# Patient Record
Sex: Female | Born: 1980 | Race: Black or African American | Hispanic: No | Marital: Single | Smoking: Current every day smoker
Health system: Southern US, Community
[De-identification: ages and names within clinical notes are randomized; demographics above are authoritative.]

## PROBLEM LIST (undated history)

## (undated) DIAGNOSIS — R569 Unspecified convulsions: Secondary | ICD-10-CM

## (undated) DIAGNOSIS — O169 Unspecified maternal hypertension, unspecified trimester: Secondary | ICD-10-CM

## (undated) DIAGNOSIS — G4730 Sleep apnea, unspecified: Secondary | ICD-10-CM

## (undated) DIAGNOSIS — J45909 Unspecified asthma, uncomplicated: Secondary | ICD-10-CM

## (undated) HISTORY — PX: TUBAL LIGATION: SHX77

---

## 2011-12-04 ENCOUNTER — Encounter (HOSPITAL_BASED_OUTPATIENT_CLINIC_OR_DEPARTMENT_OTHER): Payer: Self-pay | Admitting: Emergency Medicine

## 2011-12-04 ENCOUNTER — Emergency Department (HOSPITAL_BASED_OUTPATIENT_CLINIC_OR_DEPARTMENT_OTHER)
Admission: EM | Admit: 2011-12-04 | Discharge: 2011-12-04 | Disposition: A | Discharge status: Home / Self Care | Payer: Self-pay | Attending: Emergency Medicine | Admitting: Emergency Medicine

## 2011-12-04 DIAGNOSIS — F17200 Nicotine dependence, unspecified, uncomplicated: Secondary | ICD-10-CM | POA: Insufficient documentation

## 2011-12-04 DIAGNOSIS — J069 Acute upper respiratory infection, unspecified: Secondary | ICD-10-CM | POA: Insufficient documentation

## 2011-12-04 MED ORDER — IBUPROFEN 800 MG PO TABS: 800.0000 mg | ORAL_TABLET | Freq: Three times a day (TID) | ORAL | Status: AC | PRN

## 2011-12-04 NOTE — ED Provider Notes (Signed)
History     CSN: [id number]  Arrival date & time 12/04/11  1723   First MD Initiated Contact with Patient 12/04/11 2008      Chief Complaint  Patient presents with  . Sore Throat    (Consider location/radiation/quality/duration/timing/severity/associated sxs/prior treatment) HPI Comments: Patient reports she has been sick x 5 days.  States it started with a sore throat and is now going down into her chest.  Reports fevers the first few days that have now resolved.  States fever was as high as 105.   Had had cough, nasal congestion, bilateral ear pain, sore throat.  Denies CP, SOB.  Pt has had recent sick contacts with similar symptoms.  Pt is a smoker.  Has not been taking any medications for her symptoms.    Patient is a 31 y.o. female presenting with pharyngitis. The history is provided by the patient.  Sore Throat Associated symptoms include congestion, coughing, a fever and a sore throat. Pertinent negatives include no chest pain or neck pain.    History reviewed. No pertinent past medical history.  Past Surgical History  Procedure Date  . Tubal ligation   . Cesarean section x 3    No family history on file.  History  Substance Use Topics  . Smoking status: Current Everyday Smoker    Types: Cigarettes  . Smokeless tobacco: Not on file  . Alcohol Use: No    OB History    Grav Para Term Preterm Abortions TAB SAB Ect Mult Living                  Review of Systems  Constitutional: Positive for fever.  HENT: Positive for ear pain, congestion, sore throat and rhinorrhea. Negative for trouble swallowing, neck pain and neck stiffness.   Respiratory: Positive for cough. Negative for shortness of breath, wheezing and stridor.   Cardiovascular: Negative for chest pain.    Allergies  Review of patient's allergies indicates no known allergies.  Home Medications   Current Outpatient Rx  Name Route Sig Dispense Refill  . GREEN COFFEE BEAN 400 MG PO CAPS Oral Take 1  capsule by mouth daily.      BP 152/89  Pulse 99  Temp 98.3 F (36.8 C) (Oral)  Resp 16  Ht 5\' 2"  (1.575 m)  Wt 242 lb (109.77 kg)  BMI 44.26 kg/m2  SpO2 98%  LMP 10/11/2011  Physical Exam  Nursing note and vitals reviewed. Constitutional: She appears well-developed and well-nourished. No distress.  HENT:  Head: Normocephalic and atraumatic.  Mouth/Throat: Uvula is midline. Mucous membranes are not dry. No uvula swelling. Posterior oropharyngeal edema and posterior oropharyngeal erythema present. No oropharyngeal exudate or tonsillar abscesses.       Bilateral tonsillar enlargement is symmetric.    Eyes: Conjunctivae are normal. Right eye exhibits no discharge. Left eye exhibits no discharge. No scleral icterus.  Neck: Neck supple.  Cardiovascular: Normal rate and regular rhythm.   Pulmonary/Chest: Effort normal and breath sounds normal. No respiratory distress. She has no wheezes. She has no rales.  Neurological: She is alert.  Skin: She is not diaphoretic.  Psychiatric: She has a normal mood and affect. Her behavior is normal.    ED Course  Procedures (including critical care time)   Labs Reviewed  RAPID STREP SCREEN   No results found.   1. URI (upper respiratory infection)       MDM  Pt with upper respiratory symptoms x 5 days.  Symptoms are progressing  from sore throat to cough.   Currently afebrile, nontoxic.  Lungs CTAB, oroaphrynx with tonsillar enlargement but without exudate or anterior cervical lymphadenopathy.  Strep screen is negative.  Per centor criteria, no need for strep A dna test.  Likely viral infection.  Discussed diagnosis and plan with patient.  Pt given return precautions.  Pt verbalizes understanding and agrees with plan.          [place], [place] 12/04/11 2032

## 2011-12-04 NOTE — ED Provider Notes (Signed)
Medical screening examination/treatment/procedure(s) were performed by non-physician practitioner and as supervising physician I was immediately available for consultation/collaboration.   Rolan Bucco, MD 12/04/11 [phone]

## 2011-12-04 NOTE — ED Notes (Signed)
PA at bedside for evaluation

## 2011-12-04 NOTE — ED Notes (Signed)
Pt c/o sore throat since Tues

## 2013-01-09 ENCOUNTER — Encounter (HOSPITAL_COMMUNITY): Payer: Self-pay | Admitting: Emergency Medicine

## 2013-01-09 ENCOUNTER — Emergency Department (HOSPITAL_COMMUNITY)
Admission: EM | Admit: 2013-01-09 | Discharge: 2013-01-10 | Discharge status: Against Medical Advice | Payer: Self-pay | Attending: Emergency Medicine | Admitting: Emergency Medicine

## 2013-01-09 DIAGNOSIS — R5381 Other malaise: Secondary | ICD-10-CM | POA: Insufficient documentation

## 2013-01-09 DIAGNOSIS — R42 Dizziness and giddiness: Secondary | ICD-10-CM | POA: Insufficient documentation

## 2013-01-09 DIAGNOSIS — R51 Headache: Secondary | ICD-10-CM | POA: Insufficient documentation

## 2013-01-09 DIAGNOSIS — F17200 Nicotine dependence, unspecified, uncomplicated: Secondary | ICD-10-CM | POA: Insufficient documentation

## 2013-01-09 NOTE — ED Notes (Signed)
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 Pt reports that she has a HA, dizziness and has been feeling fatigued, increasing over the last 24 hours.  Pt has not been further evaluated or take any medications at this time for seizures.

## 2013-01-10 NOTE — ED Notes (Signed)
Pt reporting desire to go home and no longer wants to wait for MD.

## 2013-07-30 ENCOUNTER — Emergency Department (HOSPITAL_COMMUNITY)
Admission: EM | Admit: 2013-07-30 | Discharge: 2013-07-30 | Disposition: A | Discharge status: Home / Self Care | Payer: Medicaid Other | Attending: Emergency Medicine | Admitting: Emergency Medicine

## 2013-07-30 ENCOUNTER — Encounter (HOSPITAL_COMMUNITY): Payer: Self-pay | Admitting: Emergency Medicine

## 2013-07-30 DIAGNOSIS — F17200 Nicotine dependence, unspecified, uncomplicated: Secondary | ICD-10-CM | POA: Insufficient documentation

## 2013-07-30 DIAGNOSIS — K029 Dental caries, unspecified: Secondary | ICD-10-CM | POA: Insufficient documentation

## 2013-07-30 DIAGNOSIS — K089 Disorder of teeth and supporting structures, unspecified: Secondary | ICD-10-CM | POA: Insufficient documentation

## 2013-07-30 DIAGNOSIS — K0889 Other specified disorders of teeth and supporting structures: Secondary | ICD-10-CM

## 2013-07-30 MED ORDER — HYDROCODONE-ACETAMINOPHEN 5-325 MG PO TABS: 1.0000 | ORAL_TABLET | ORAL | Status: DC | PRN

## 2013-07-30 MED ORDER — PENICILLIN V POTASSIUM 500 MG PO TABS: 500.0000 mg | ORAL_TABLET | Freq: Four times a day (QID) | ORAL | Status: AC

## 2013-07-30 MED ORDER — OXYCODONE-ACETAMINOPHEN 5-325 MG PO TABS
1.0000 | ORAL_TABLET | Freq: Once | ORAL | Status: AC
  Administered 2013-07-30: 1 via ORAL
  Filled 2013-07-30: qty 1

## 2013-07-30 NOTE — ED Provider Notes (Signed)
CSN: [id number]     Arrival date & time 07/30/13  1132 History  This chart was scribed for non-physician practitioner, Trixie DredgeEmily Chistian Kasler, PA-C working with Lyanne CoKevin M Campos, MD by Greggory StallionKayla Andersen, ED scribe. This patient was seen in room TR10C/TR10C and the patient's care was started at 1:38 PM.   Chief Complaint  Patient presents with  . Dental Pain   The history is provided by the patient. No language interpreter was used.   HPI Comments: Allison James is a 33 y.o. female who presents to the Emergency Department complaining of gradual onset, intermittent left lower dental pain that started one year ago. Her current episode started 2 weeks ago. Pt states she has also started to have right sided dental pain and intermittent facial swelling. Eating worsens the pain. She has tried [facility] powder on her teeth and taken Advil and Aleve with no relief. Pt has also swished peroxide with no relief. Pt has tried to call a dentist but she states she has to wait on her Medicaid. Denies fever, sore throat, trouble swallowing, difficulty breathing.   History reviewed. No pertinent past medical history. Past Surgical History  Procedure Laterality Date  . Tubal ligation    . Cesarean section  x 3   History reviewed. No pertinent family history. History  Substance Use Topics  . Smoking status: Current Every Day Smoker -- 1.00 packs/day    Types: Cigarettes  . Smokeless tobacco: Never Used  . Alcohol Use: Yes     Comment: socially   OB History   Grav Para Term Preterm Abortions TAB SAB Ect Mult Living                 Review of Systems  Constitutional: Negative for fever.  HENT: Positive for dental problem and facial swelling. Negative for sore throat and trouble swallowing.   All other systems reviewed and are negative.   Allergies  Review of patient's allergies indicates no known allergies.  Home Medications  No current outpatient prescriptions on file.  BP 159/97  Pulse 101  Temp(Src) 98.3 F (36.8 C)   Resp 18  SpO2 97%  Physical Exam  Nursing note and vitals reviewed. Constitutional: She appears well-developed and well-nourished. No distress.  HENT:  Head: Normocephalic and atraumatic.  Hypertrophic tonsils. No erythema or exudate. Left lower second molar with remote fracture and deep decay. Tender to percussion. No paratracheal tenderness. Left mandibular tenderness. No facial swelling.  Neck: Neck supple.  Pulmonary/Chest: Effort normal.  Lymphadenopathy:  No submandibular, submental or anterior cervical lymphadenopathy.   Neurological: She is alert.  Skin: She is not diaphoretic.    ED Course  Procedures (including critical care time)  DIAGNOSTIC STUDIES: Oxygen Saturation is 97% on RA, normal by my interpretation.    COORDINATION OF CARE: 1:43 PM-Discussed treatment plan which includes an antibiotic and pain medication with pt at bedside and pt agreed to plan. Will give pt dental referrals and advised her to follow up.   Labs Review Labs Reviewed - No data to display Imaging Review No results found.   EKG Interpretation None      MDM   Final diagnoses:  Dental caries  Pain, dental     Afebrile, nontoxic patient with new dental pain.  No obvious abscess.  No concerning findings on exam.Patient with remote fracture to molar with worsening pain. No facial swelling the patient does have tenderness to percussion of the molar.   Doubt deep space head or neck infection.  Doubt  Ludwig's angina.  D/C home with pain medication and dental follow up.  Discussed findings, treatment, and follow up  with patient.  Pt given return precautions.  Pt verbalizes understanding and agrees with plan.       I personally performed the services described in this documentation, which was scribed in my presence. The recorded information has been reviewed and is accurate.  Trixie Dredge, PA-C 07/30/13 1626

## 2013-07-30 NOTE — Discharge Instructions (Signed)
Read the information below.  Use the prescribed medication as directed.  Please discuss all new medications with your pharmacist.  Do not take additional tylenol while taking the prescribed pain medication to avoid overdose.  You may return to the Emergency Department at any time for worsening condition or any new symptoms that concern you.  If there is any possibility that you might be pregnant, please let your health care provider know and discuss this with the pharmacist to ensure medication safety.  Please call the dentist listed above within 48 hours to schedule a close follow up appointment.  If you develop fevers, swelling in your face, difficulty swallowing or breathing, return to the ER immediately for a recheck.     Dental Caries  Dental caries (also called tooth decay) is the most common oral disease. It can occur at any age, but is more common in children and young adults.  HOW DENTAL CARIES DEVELOPS  The process of decay begins when bacteria and foods (particularly sugars and starches) combine in your mouth to produce plaque. Plaque is a substance that sticks to the hard, outer surface of a tooth (enamel). The bacteria in plaque produce acids that attack enamel. These acids may also attack the root surface of a tooth (cementum) if it is exposed. Repeated attacks dissolve these surfaces and create holes in the tooth (cavities). If left untreated, the acids destroy the other layers of the tooth.  RISK FACTORS  Frequent sipping of sugary beverages.   Frequent snacking on sugary and starchy foods, especially those that easily get stuck in the teeth.   Poor oral hygiene.   Dry mouth.   Substance abuse such as methamphetamine abuse.   Broken or poor-fitting dental restorations.   Eating disorders.   Gastroesophageal reflux disease (GERD).   Certain radiation treatments to the head and neck. SYMPTOMS In the early stages of dental caries, symptoms are seldom present. Sometimes  white, chalky areas may be seen on the enamel or other tooth layers. In later stages, symptoms may include:  Pits and holes on the enamel.  Toothache after sweet, hot, or cold foods or drinks are consumed.  Pain around the tooth.  Swelling around the tooth. DIAGNOSIS  Most of the time, dental caries is detected during a regular dental checkup. A diagnosis is made after a thorough medical and dental history is taken and the surfaces of your teeth are checked for signs of dental caries. Sometimes special instruments, such as lasers, are used to check for dental caries. Dental X-ray exams may be taken so that areas not visible to the eye (such as between the contact areas of the teeth) can be checked for cavities.  TREATMENT  If dental caries is in its early stages, it may be reversed with a fluoride treatment or an application of a remineralizing agent at the dental office. Thorough brushing and flossing at home is needed to aid these treatments. If it is in its later stages, treatment depends on the location and extent of tooth destruction:   If a small area of the tooth has been destroyed, the destroyed area will be removed and cavities will be filled with a material such as gold, silver amalgam, or composite resin.   If a large area of the tooth has been destroyed, the destroyed area will be removed and a cap (crown) will be fitted over the remaining tooth structure.   If the center part of the tooth (pulp) is affected, a procedure called a  root canal will be needed before a filling or crown can be placed.   If most of the tooth has been destroyed, the tooth may need to be pulled (extracted). HOME CARE INSTRUCTIONS You can prevent, stop, or reverse dental caries at home by practicing good oral hygiene. Good oral hygiene includes:  Thoroughly cleaning your teeth at least twice a day with a toothbrush and dental floss.   Using a fluoride toothpaste. A fluoride mouth rinse may also be  used if recommended by your dentist or health care provider.   Restricting the amount of sugary and starchy foods and sugary liquids you consume.   Avoiding frequent snacking on these foods and sipping of these liquids.   Keeping regular visits with a dentist for checkups and cleanings. PREVENTION   Practice good oral hygiene.  Consider a dental sealant. A dental sealant is a coating material that is applied by your dentist to the pits and grooves of teeth. The sealant prevents food from being trapped in them. It may protect the teeth for several years.  Ask about fluoride supplements if you live in a community without fluorinated water or with water that has a low fluoride content. Use fluoride supplements as directed by your dentist or health care provider.  Allow fluoride varnish applications to teeth if directed by your dentist or health care provider. Document Released: 01/02/2002 Document Revised: 12/13/2012 Document Reviewed: 04/14/2012 [facility] Patient Information 2014 [place], [place].  Dental Care and Dentist Visits Dental care supports good overall health. Regular dental visits can also help you avoid dental pain, bleeding, infection, and other more serious health problems in the future. It is important to keep the mouth healthy because diseases in the teeth, gums, and other oral tissues can spread to other areas of the body. Some problems, such as diabetes, heart disease, and pre-term labor have been associated with poor oral health.  See your dentist every 6 months. If you experience emergency problems such as a toothache or broken tooth, go to the dentist right away. If you see your dentist regularly, you may catch problems early. It is easier to be treated for problems in the early stages.  WHAT TO EXPECT AT A DENTIST VISIT  Your dentist will look for many common oral health problems and recommend proper treatment. At your regular dental visit, you can expect:  Gentle  cleaning of the teeth and gums. This includes scraping and polishing. This helps to remove the sticky substance around the teeth and gums (plaque). Plaque forms in the mouth shortly after eating. Over time, plaque hardens on the teeth as tartar. If tartar is not removed regularly, it can cause problems. Cleaning also helps remove stains.  Periodic X-rays. These pictures of the teeth and supporting bone will help your dentist assess the health of your teeth.  Periodic fluoride treatments. Fluoride is a natural mineral shown to help strengthen teeth. Fluoride treatmentinvolves applying a fluoride gel or varnish to the teeth. It is most commonly done in children.  Examination of the mouth, tongue, jaws, teeth, and gums to look for any oral health problems, such as:  Cavities (dental caries). This is decay on the tooth caused by plaque, sugar, and acid in the mouth. It is best to catch a cavity when it is small.  Inflammation of the gums caused by plaque buildup (gingivitis).  Problems with the mouth or malformed or misaligned teeth.  Oral cancer or other diseases of the soft tissues or jaws. KEEP YOUR TEETH AND  GUMS HEALTHY For healthy teeth and gums, follow these general guidelines as well as your dentist's specific advice:  Have your teeth professionally cleaned at the dentist every 6 months.  Brush twice daily with a fluoride toothpaste.  Floss your teeth daily.  Ask your dentist if you need fluoride supplements, treatments, or fluoride toothpaste.  Eat a healthy diet. Reduce foods and drinks with added sugar.  Avoid smoking. TREATMENT FOR ORAL HEALTH PROBLEMS If you have oral health problems, treatment varies depending on the conditions present in your teeth and gums.  Your caregiver will most likely recommend good oral hygiene at each visit.  For cavities, gingivitis, or other oral health disease, your caregiver will perform a procedure to treat the problem. This is typically  done at a separate appointment. Sometimes your caregiver will refer you to another dental specialist for specific tooth problems or for surgery. SEEK IMMEDIATE DENTAL CARE IF:  You have pain, bleeding, or soreness in the gum, tooth, jaw, or mouth area.  A permanent tooth becomes loose or separated from the gum socket.  You experience a blow or injury to the mouth or jaw area. Document Released: 12/23/2010 Document Revised: 07/05/2011 Document Reviewed: 12/23/2010 [facility] Patient Information 2014 [place], [place].   Emergency Department Resource Guide 1) Find a Doctor and Pay Out of Pocket Although you won't have to find out who is covered by your insurance plan, it is a good idea to ask around and get recommendations. You will then need to call the office and see if the doctor you have chosen will accept you as a new patient and what types of options they offer for patients who are self-pay. Some doctors offer discounts or will set up payment plans for their patients who do not have insurance, but you will need to ask so you aren't surprised when you get to your appointment.  2) Contact Your Local Health Department Not all health departments have doctors that can see patients for sick visits, but many do, so it is worth a call to see if yours does. If you don't know where your local health department is, you can check in your phone book. The CDC also has a tool to help you locate your state's health department, and many state websites also have listings of all of their local health departments.  3) Find a Walk-in Clinic If your illness is not likely to be very severe or complicated, you may want to try a walk in clinic. These are popping up all over the country in pharmacies, drugstores, and shopping centers. They're usually staffed by nurse practitioners or physician assistants that have been trained to treat common illnesses and complaints. They're usually fairly quick and inexpensive. However,  if you have serious medical issues or chronic medical problems, these are probably not your best option.  No Primary Care Doctor: - Call Health Connect at  [phone] - they can help you locate a primary care doctor that  accepts your insurance, provides certain services, etc. - Physician Referral Service- [phone]  Chronic Pain Problems: Organization         Address  Phone   Notes  [facility]  [phone] Patients need to be referred by their primary care doctor.   Medication Assistance: Organization         Address  Phone   Notes  [facility] Medication [facility] [address] [place] [place], [place] [address] [phone] --Must be a resident of  [place] -- Must have NO insurance coverage whatsoever (no Medicaid/ Medicare, etc.) -- The pt. MUST have a primary care doctor that directs their care regularly and follows them in the community   [facility]ist  [phone]   [employer]  [phone]    Agencies that provide inexpensive medical care: Organization         Address  Phone   Notes  [facility]  [phone]   [facility]    [phone]   [facility] [address] [place], [place] [address] [phone]   [facility] [address], [place] [phone]   Planned Parenthood    [phone]   [facility]    [phone]   [facility]  [address], [place] Phone:  [phone], Fax:  [phone] Hours of Operation:  9 am - 6 pm, M-F.  Also accepts Medicaid/Medicare and self-pay.  [facility]  [address], [place] Phone: [phone], Fax: [phone]. Hours of Operation:  8:30 am - 5:30 pm, M-F.  Also accepts Medicaid and self-pay.  [facility] [place] [address], [place] Phone: [phone]   [facility] [address] Natasha Bence [place], [place] [phone], Ext. [phone] Mondays & Thursdays: 7-9 AM.  First 15 patients are seen on a first come, first serve basis.    Medicaid-accepting [facility] Providers:  Organization         Address  Phone   Notes  [facility] [address], [place] [phone] Also accepts self-pay patients.  [facility] [address] Laurell Josephs [place], [place]  [phone]   [facility] [address], [place] [phone]   [facility] [address], [place] [phone]   Renaye Rakers [address], [place]   [phone] Only accepts [place] Access [place] patients after they have their name applied to their card.   Self-Pay (no insurance) in [place]:  Organization         Address  Phone   Notes  Sickle Cell Patients, [facility] [address] [place], [place] [phone]   [facility] [address] [place], [place] [phone]   [facility] [place]  [address], [place] [phone]   [facility]/Dr. Osei-Bonsu  [address] [place] or [address], [place] [phone] Phone number for both [place] and [place] locations is the same.  [facility] [address], [place] [phone]   [facility] [address] [place] or [address] [phone]   [facility] [address] [place] [phone], phone; [phone], fax Sees patients 1st and 3rd Saturday of every month.  Must not qualify for public or private insurance (i.e. Medicaid, Medicare, [place] Health Choice, Veterans' Benefits)  Household income should be no more than 200% of the poverty level The clinic cannot treat you if you are pregnant or think you are  pregnant  Sexually transmitted diseases are not treated at the clinic.    Dental Care: Organization         Address  Phone  Notes  [facility] Department of [facility] [address] [place], [place] [phone] Accepts children up to age 32 who are enrolled in [place] or [place] Health Choice; pregnant women with a Medicaid card; and children who have applied for Medicaid or [place] Health Choice, but were declined, whose parents can pay a reduced fee at time of service.  [facility] Department of [facility]  [address], [place] [phone] Accepts children up to age 13 who are enrolled in [place] or [place] Health Choice; pregnant women with a Medicaid card; and children who have applied for Medicaid or  Health Choice, but were declined, whose parents can pay a reduced fee at time of service.  [facility]  [address] [place], [place] [phone] Patients are seen by appointment only. Walk-ins are not accepted. [facility] will see patients 76 years of age and older. Monday - Tuesday (8am-5pm) Most Wednesdays (8:30-5pm) $30 per visit, cash only  [facility] Adult Dental Access PROGRAM  [address], [facility] [phone] Patients are seen by appointment only. Walk-ins are not accepted. [facility] will see patients 48 years of age and older. One Wednesday Evening (Monthly: Volunteer Based).  $30 per visit, cash only  [employer]  [phone] for adults; Children under age 29, call Graduate Pediatric Dentistry at [phone]. Children aged 40-14, please call [phone] to request a pediatric application.  Dental services are provided in all areas of dental care including fillings, crowns and bridges, complete and partial dentures, implants, gum treatment, root canals, and extractions. Preventive care is also provided. Treatment is provided to both adults and  children. Patients are selected via a lottery and there is often a waiting list.   [facility] [address], [place]  [phone] [email or web address]   [facility] [address] [place], [place] [phone], Ext. [phone] Second and Fourth Thursday of each month, opens at 6:30 AM; Clinic ends at 9 AM.  Patients are seen on a first-come first-served basis, and a limited number are seen during each clinic.   [facility]  [address] [place], [place] [phone]   Eligibility Requirements You must have lived in [place], [place], or [place] counties for at least the last three months.   You cannot be eligible for state or federal sponsored [place], including [employer], [place], or [employer].   You generally cannot be eligible for healthcare insurance through your employer.    How to apply: Eligibility screenings are held every Tuesday and Wednesday afternoon from 1:00 pm until 4:00 pm. You do not need an appointment for the interview!  [facility] [address] [place], [place] [phone]   [facility] Health Department  [phone]   [facility] Health Department  [phone]   [facility] Health Department  [phone]    Behavioral Health Resources in the Community: Intensive Outpatient Programs Organization         Address  Phone  Notes  [facility] [address], [place], [place] [phone]   [facility] Outpatient [address] [place], [place] [phone]   [facility] [address] [place], [place]  [phone]   [facility] Health [address],  [place], [place] [phone] or [phone]   Substance Abuse Resources Organization  Address  Phone  Notes  [facility]  [phone]   [facility]  [phone]   [facility] [place]  [phone]    Floydene Flock  [phone]   Residential & Outpatient Substance Abuse Program  [phone]   Psychological Services Organization         Address  Phone  Notes  [facility] Behavioral Health  [phone]   [facility] Services  [phone]   [facility] Mental Health [address] [place] [phone] or [phone]    Mobile Crisis Teams Organization         Address  Phone  Notes  [facility], Mobile Crisis Care Unit  [phone]   [facility]  [address] [place], [place] [phone]   Doristine Locks [address] [place] [phone]    Self-Help/Support Groups Organization         Address  Phone             Notes  [facility] [place] - variety of support groups  336- I7437963 Call for more information  Narcotics Anonymous (NA), [facility] [address], [employer] [place]  2 meetings at this location   Statistician         Address  Phone  Notes  [facility] [address],    [place]  [phone]   [facility]  [address] [place] [address], [place], [place] [phone]   [facility] [address] [place], [place] [phone] Admissions: 8am-3pm M-F  [facility] [address]    [place], [place] [phone]   [facility] [address] [place], [place], [place] [phone]   [facility] [address],  [place], [place] [phone]   [facility] - Intensive Outpatient [address] Laurell Joseph[address], [place], [place] [phone]   [facility] ([facility] [address] [place].,  [place], [place] [phone] or [phone]   [facility] ([facility]) [address] [place], [place] [phone] Accepts Medicaid  [facility] [address]  [place] [phone] Substance Abuse/Addiction Treatment   [place] Organization         Address  Phone  Notes  [facility]  [phone]   Angie Fava, PhD [address] Ervin Knack [place], [place]   [phone] or [phone]   [facility] Behavioral   [address] [place], [place] [phone]   [facility] [address] [place], [place] [phone] Insurance/Medicaid/sponsorship through [facility] and Families [address]                                    [place], [place] [phone] Therapy/tele-psych/case  [facility] [address], [place] [phone]    Dr. Lolly Mustache  [phone]   [facility] [place]  [employer] [facility] Dept. 1) [address] [place] 2) [address], [place] 3)  [address], [place] [phone]  [phone]   [facility] Child Abuse Hotline (810)266-9021 or [phone] (After Hours)

## 2013-07-30 NOTE — ED Notes (Signed)
Left lower jaw pain. PT endorses broken tooth >5829month. NO foul taste in mouth. NO oral swelling noted

## 2013-07-31 NOTE — ED Provider Notes (Signed)
Medical screening examination/treatment/procedure(s) were performed by non-physician practitioner and as supervising physician I was immediately available for consultation/collaboration.   EKG Interpretation None        Janene Yousuf M Ashawnti Tangen, MD 07/31/13 0739 

## 2014-07-24 ENCOUNTER — Encounter (HOSPITAL_BASED_OUTPATIENT_CLINIC_OR_DEPARTMENT_OTHER): Payer: Self-pay

## 2014-07-24 ENCOUNTER — Emergency Department (HOSPITAL_BASED_OUTPATIENT_CLINIC_OR_DEPARTMENT_OTHER)
Admission: EM | Admit: 2014-07-24 | Discharge: 2014-07-25 | Disposition: A | Discharge status: Home / Self Care | Payer: Medicaid Other | Attending: Emergency Medicine | Admitting: Emergency Medicine

## 2014-07-24 DIAGNOSIS — R51 Headache: Secondary | ICD-10-CM | POA: Diagnosis present

## 2014-07-24 DIAGNOSIS — I10 Essential (primary) hypertension: Secondary | ICD-10-CM | POA: Insufficient documentation

## 2014-07-24 DIAGNOSIS — R0789 Other chest pain: Secondary | ICD-10-CM | POA: Insufficient documentation

## 2014-07-24 DIAGNOSIS — Z720 Tobacco use: Secondary | ICD-10-CM | POA: Insufficient documentation

## 2014-07-24 HISTORY — DX: Morbid (severe) obesity due to excess calories: E66.01

## 2014-07-24 HISTORY — DX: Unspecified maternal hypertension, unspecified trimester: O16.9

## 2014-07-24 LAB — CBC WITH DIFFERENTIAL/PLATELET
BASOS ABS: 0 10*3/uL (ref 0.0–0.1)
Basophils Relative: 0 % (ref 0–1)
EOS PCT: 2 % (ref 0–5)
Eosinophils Absolute: 0.1 10*3/uL (ref 0.0–0.7)
HEMATOCRIT: 36.4 % (ref 36.0–46.0)
Hemoglobin: 12 g/dL (ref 12.0–15.0)
LYMPHS ABS: 3.3 10*3/uL (ref 0.7–4.0)
LYMPHS PCT: 36 % (ref 12–46)
MCH: 27.6 pg (ref 26.0–34.0)
MCHC: 33 g/dL (ref 30.0–36.0)
MCV: 83.9 fL (ref 78.0–100.0)
MONO ABS: 0.5 10*3/uL (ref 0.1–1.0)
MONOS PCT: 5 % (ref 3–12)
NEUTROS ABS: 5.3 10*3/uL (ref 1.7–7.7)
NEUTROS PCT: 57 % (ref 43–77)
Platelets: 386 10*3/uL (ref 150–400)
RBC: 4.34 MIL/uL (ref 3.87–5.11)
RDW: 14.8 % (ref 11.5–15.5)
WBC: 9.2 10*3/uL (ref 4.0–10.5)

## 2014-07-24 LAB — BASIC METABOLIC PANEL
ANION GAP: 8 (ref 5–15)
BUN: 10 mg/dL (ref 6–23)
CALCIUM: 8.9 mg/dL (ref 8.4–10.5)
CHLORIDE: 105 mmol/L (ref 96–112)
CO2: 26 mmol/L (ref 19–32)
Creatinine, Ser: 0.76 mg/dL (ref 0.50–1.10)
GFR calc non Af Amer: >90 mL/min (ref >90)
GLUCOSE: 99 mg/dL (ref 70–99)
POTASSIUM: 3.6 mmol/L (ref 3.5–5.1)
SODIUM: 139 mmol/L (ref 135–145)

## 2014-07-24 LAB — TROPONIN I: Troponin I: <0.03 ng/mL (ref <0.031)

## 2014-07-24 NOTE — ED Provider Notes (Signed)
CSN: [id number]     Arrival date & time 07/24/14  2127 History   This chart was scribed for Geoffery Lyons, MD by Freida Busman, ED Scribe. This patient was seen in room MH07/MH07 and the patient's care was started 10:31 PM.     Chief Complaint  Patient presents with  . Hypertension    Patient is a 34 y.o. female presenting with hypertension. The history is provided by the patient. No language interpreter was used.  Hypertension This is a new problem. Associated symptoms include chest pain and headaches. Pertinent negatives include no abdominal pain and no shortness of breath. Nothing aggravates the symptoms. Nothing relieves the symptoms.     HPI Comments:  Allison James is a 34 y.o. female with a h/o HTN during pregnancy who presents to the Emergency Department complaining of elevated BP today; states her BP was 178/115 PTA. She reports associated HA earlier today that has resolved and intermittent pain across her chest that she has had for "awhile". Pt denies a h/o HTN. She also denies CP and exertional exacerbation of her symptoms. No alleviating factors noted. Pt is a smoker    Past Medical History  Diagnosis Date  . Hypertension during pregnancy   . Morbid obesity    Past Surgical History  Procedure Laterality Date  . Tubal ligation    . Cesarean section  x 3   No family history on file. History  Substance Use Topics  . Smoking status: Current Every Day Smoker -- 1.00 packs/day    Types: Cigarettes  . Smokeless tobacco: Never Used  . Alcohol Use: Yes     Comment: socially   OB History    No data available     Review of Systems  Respiratory: Negative for shortness of breath.   Cardiovascular: Positive for chest pain.  Gastrointestinal: Negative for abdominal pain.  Neurological: Positive for headaches.  All other systems reviewed and are negative.     Allergies  Review of patient's allergies indicates no known allergies.  Home Medications   Prior to Admission  medications   Not on File   BP 156/91 mmHg  Pulse 100  Temp(Src) 98.8 F (37.1 C) (Oral)  Resp 14  Ht  (1.575 m)  Wt 321 lb (145.605 kg)  BMI 58.70 kg/m2  SpO2 98%  LMP 07/19/2014 Physical Exam  Constitutional: She is oriented to person, place, and time. No distress.  Obese  HENT:  Head: Normocephalic and atraumatic.  Right Ear: Hearing normal.  Left Ear: Hearing normal.  Nose: Nose normal.  Mouth/Throat: Oropharynx is clear and moist and mucous membranes are normal.  Eyes: Conjunctivae and EOM are normal. Pupils are equal, round, and reactive to light.  Neck: Normal range of motion. Neck supple.  Cardiovascular: Regular rhythm, S1 normal and S2 normal.  Exam reveals no gallop and no friction rub.   No murmur heard. Pulmonary/Chest: Effort normal and breath sounds normal. No respiratory distress. She exhibits no tenderness.  Abdominal: Soft. Normal appearance and bowel sounds are normal. There is no hepatosplenomegaly. There is no tenderness. There is no rebound, no guarding, no tenderness at McBurney's point and negative Murphy's sign. No hernia.  Musculoskeletal: Normal range of motion.  Neurological: She is alert and oriented to person, place, and time. She has normal strength. No cranial nerve deficit or sensory deficit. Coordination normal. GCS eye subscore is 4. GCS verbal subscore is 5. GCS motor subscore is 6.  Skin: Skin is warm, dry and intact. No  rash noted. No cyanosis.  Psychiatric: She has a normal mood and affect. Her speech is normal and behavior is normal. Thought content normal.  Nursing note and vitals reviewed.   ED Course  Procedures (including critical care time) Labs Review Labs Reviewed - No data to display  Imaging Review No results found.   EKG Interpretation None      MDM   Final diagnoses:  None    Patient presents with complaints of headache and elevated blood pressure that started while at work.  She also complains of  intermittent chest discomfort for the past week not associated with dyspnea, nausea, or radiation to the arm or jaw.  Workup today reveals normal EKG, negative troponin, and normal blood counts and electrolytes. I highly doubt a cardiac etiology as patient has no risk factors, her symptoms are atypical, however workup is negative. Her blood pressure while in the emergency department is somewhat elevated, however not emergent. Most recently her blood pressure is 147/91 and not in need of intervention. I will advise her to keep a record of her blood pressures and follow up with her primary doctor.    Geoffery Lyonsouglas Keirston Saephanh, MD 07/24/14 [phone]

## 2014-07-24 NOTE — ED Notes (Signed)
HA, elevated BP today-hx of HTN during pregnancy-no meds-also c/o CP x 3 weeks

## 2014-07-24 NOTE — Discharge Instructions (Signed)
Keep a record of your blood pressures and follow this up with your primary Dr.  Return to the emergency department if you develop any worsening or new symptoms.   Chest Pain (Nonspecific) It is often hard to give a diagnosis for the cause of chest pain. There is always a chance that your pain could be related to something serious, such as a heart attack or a blood clot in the lungs. You need to follow up with your doctor. HOME CARE  If antibiotic medicine was given, take it as directed by your doctor. Finish the medicine even if you start to feel better.  For the next few days, avoid activities that bring on chest pain. Continue physical activities as told by your doctor.  Do not use any tobacco products. This includes cigarettes, chewing tobacco, and e-cigarettes.  Avoid drinking alcohol.  Only take medicine as told by your doctor.  Follow your doctor's suggestions for more testing if your chest pain does not go away.  Keep all doctor visits you made. GET HELP IF:  Your chest pain does not go away, even after treatment.  You have a rash with blisters on your chest.  You have a fever. GET HELP RIGHT AWAY IF:   You have more pain or pain that spreads to your arm, neck, jaw, back, or belly (abdomen).  You have shortness of breath.  You cough more than usual or cough up blood.  You have very bad back or belly pain.  You feel sick to your stomach (nauseous) or throw up (vomit).  You have very bad weakness.  You pass out (faint).  You have chills. This is an emergency. Do not wait to see if the problems will go away. Call your local emergency services (911 in [place] Do not drive yourself to the hospital. MAKE SURE YOU:   Understand these instructions.  Will watch your condition.  Will get help right away if you are not doing well or get worse. Document Released: 09/29/2007 Document Revised: 04/17/2013 Document Reviewed: 09/29/2007 [employer] Patient Information 2015  [place], [place]. This information is not intended to replace advice given to you by your health care provider. Make sure you discuss any questions you have with your health care provider.  Hypertension Hypertension, commonly called high blood pressure, is when the force of blood pumping through your arteries is too strong. Your arteries are the blood vessels that carry blood from your heart throughout your body. A blood pressure reading consists of a higher number over a lower number, such as 110/72. The higher number (systolic) is the pressure inside your arteries when your heart pumps. The lower number (diastolic) is the pressure inside your arteries when your heart relaxes. Ideally you want your blood pressure below 120/80. Hypertension forces your heart to work harder to pump blood. Your arteries may become narrow or stiff. Having hypertension puts you at risk for heart disease, stroke, and other problems.  RISK FACTORS Some risk factors for high blood pressure are controllable. Others are not.  Risk factors you cannot control include:   Race. You may be at higher risk if you are African American.  Age. Risk increases with age.  Gender. Men are at higher risk than women before age 79 years. After age 10, women are at higher risk than men. Risk factors you can control include:  Not getting enough exercise or physical activity.  Being overweight.  Getting too much fat, sugar, calories, or salt in your diet.  Drinking  too much alcohol. SIGNS AND SYMPTOMS Hypertension does not usually cause signs or symptoms. Extremely high blood pressure (hypertensive crisis) may cause headache, anxiety, shortness of breath, and nosebleed. DIAGNOSIS  To check if you have hypertension, your health care provider will measure your blood pressure while you are seated, with your arm held at the level of your heart. It should be measured at least twice using the same arm. Certain conditions can cause a difference  in blood pressure between your right and left arms. A blood pressure reading that is higher than normal on one occasion does not mean that you need treatment. If one blood pressure reading is high, ask your health care provider about having it checked again. TREATMENT  Treating high blood pressure includes making lifestyle changes and possibly taking medicine. Living a healthy lifestyle can help lower high blood pressure. You may need to change some of your habits. Lifestyle changes may include:  Following the DASH diet. This diet is high in fruits, vegetables, and whole grains. It is low in salt, red meat, and added sugars.  Getting at least 2 hours of brisk physical activity every week.  Losing weight if necessary.  Not smoking.  Limiting alcoholic beverages.  Learning ways to reduce stress. If lifestyle changes are not enough to get your blood pressure under control, your health care provider may prescribe medicine. You may need to take more than one. Work closely with your health care provider to understand the risks and benefits. HOME CARE INSTRUCTIONS  Have your blood pressure rechecked as directed by your health care provider.   Take medicines only as directed by your health care provider. Follow the directions carefully. Blood pressure medicines must be taken as prescribed. The medicine does not work as well when you skip doses. Skipping doses also puts you at risk for problems.   Do not smoke.   Monitor your blood pressure at home as directed by your health care provider. SEEK MEDICAL CARE IF:   You think you are having a reaction to medicines taken.  You have recurrent headaches or feel dizzy.  You have swelling in your ankles.  You have trouble with your vision. SEEK IMMEDIATE MEDICAL CARE IF:  You develop a severe headache or confusion.  You have unusual weakness, numbness, or feel faint.  You have severe chest or abdominal pain.  You vomit  repeatedly.  You have trouble breathing. MAKE SURE YOU:   Understand these instructions.  Will watch your condition.  Will get help right away if you are not doing well or get worse. Document Released: 04/12/2005 Document Revised: 08/27/2013 Document Reviewed: 02/02/2013 [facility] Patient Information 2015 [place], [place]. This information is not intended to replace advice given to you by your health care provider. Make sure you discuss any questions you have with your health care provider.

## 2017-07-25 ENCOUNTER — Encounter (HOSPITAL_COMMUNITY): Payer: Self-pay | Admitting: Emergency Medicine

## 2017-07-25 DIAGNOSIS — I10 Essential (primary) hypertension: Secondary | ICD-10-CM | POA: Insufficient documentation

## 2017-07-25 DIAGNOSIS — R197 Diarrhea, unspecified: Secondary | ICD-10-CM | POA: Insufficient documentation

## 2017-07-25 DIAGNOSIS — R05 Cough: Secondary | ICD-10-CM | POA: Insufficient documentation

## 2017-07-25 DIAGNOSIS — F17210 Nicotine dependence, cigarettes, uncomplicated: Secondary | ICD-10-CM | POA: Insufficient documentation

## 2017-07-25 DIAGNOSIS — J029 Acute pharyngitis, unspecified: Secondary | ICD-10-CM | POA: Insufficient documentation

## 2017-07-25 LAB — URINALYSIS, ROUTINE W REFLEX MICROSCOPIC
Bilirubin Urine: NEGATIVE
Glucose, UA: NEGATIVE mg/dL
Ketones, ur: NEGATIVE mg/dL
Nitrite: NEGATIVE
Protein, ur: 30 mg/dL — AB
SPECIFIC GRAVITY, URINE: 1.021 (ref 1.005–1.030)
pH: 5 (ref 5.0–8.0)

## 2017-07-25 LAB — CBC
HEMATOCRIT: 42.6 % (ref 36.0–46.0)
Hemoglobin: 13.5 g/dL (ref 12.0–15.0)
MCH: 27.8 pg (ref 26.0–34.0)
MCHC: 31.7 g/dL (ref 30.0–36.0)
MCV: 87.7 fL (ref 78.0–100.0)
Platelets: 320 10*3/uL (ref 150–400)
RBC: 4.86 MIL/uL (ref 3.87–5.11)
RDW: 15.8 % — AB (ref 11.5–15.5)
WBC: 18.3 10*3/uL — ABNORMAL HIGH (ref 4.0–10.5)

## 2017-07-25 LAB — COMPREHENSIVE METABOLIC PANEL
ALBUMIN: 3.7 g/dL (ref 3.5–5.0)
ALT: 27 U/L (ref 14–54)
AST: 24 U/L (ref 15–41)
Alkaline Phosphatase: 80 U/L (ref 38–126)
Anion gap: 10 (ref 5–15)
BILIRUBIN TOTAL: 0.9 mg/dL (ref 0.3–1.2)
BUN: 7 mg/dL (ref 6–20)
CHLORIDE: 102 mmol/L (ref 101–111)
CO2: 27 mmol/L (ref 22–32)
CREATININE: 0.8 mg/dL (ref 0.44–1.00)
Calcium: 9 mg/dL (ref 8.9–10.3)
GFR calc Af Amer: >60 mL/min (ref >60)
GFR calc non Af Amer: >60 mL/min (ref >60)
GLUCOSE: 154 mg/dL — AB (ref 65–99)
POTASSIUM: 3.9 mmol/L (ref 3.5–5.1)
Sodium: 139 mmol/L (ref 135–145)
TOTAL PROTEIN: 7.8 g/dL (ref 6.5–8.1)

## 2017-07-25 LAB — I-STAT BETA HCG BLOOD, ED (MC, WL, AP ONLY): HCG, QUANTITATIVE: 15.3 m[IU]/mL — AB (ref <5)

## 2017-07-25 LAB — LIPASE, BLOOD: Lipase: 25 U/L (ref 11–51)

## 2017-07-25 MED ORDER — ACETAMINOPHEN 325 MG PO TABS
650.0000 mg | ORAL_TABLET | Freq: Once | ORAL | Status: AC | PRN
  Administered 2017-07-26: 650 mg via ORAL
  Filled 2017-07-25: qty 2

## 2017-07-25 NOTE — ED Triage Notes (Signed)
Patient c/o fever, sore throat, N/V/D since yesterday.

## 2017-07-26 ENCOUNTER — Emergency Department (HOSPITAL_COMMUNITY)
Admission: EM | Admit: 2017-07-26 | Discharge: 2017-07-26 | Disposition: A | Discharge status: Home / Self Care | Payer: Self-pay | Attending: Emergency Medicine | Admitting: Emergency Medicine

## 2017-07-26 DIAGNOSIS — R059 Cough, unspecified: Secondary | ICD-10-CM

## 2017-07-26 DIAGNOSIS — J029 Acute pharyngitis, unspecified: Secondary | ICD-10-CM

## 2017-07-26 DIAGNOSIS — R05 Cough: Secondary | ICD-10-CM

## 2017-07-26 DIAGNOSIS — R197 Diarrhea, unspecified: Secondary | ICD-10-CM

## 2017-07-26 LAB — RAPID STREP SCREEN (MED CTR MEBANE ONLY): STREPTOCOCCUS, GROUP A SCREEN (DIRECT): NEGATIVE

## 2017-07-26 MED ORDER — ONDANSETRON 4 MG PO TBDP: 4.0000 mg | ORAL_TABLET | Freq: Three times a day (TID) | ORAL | 0 refills | Status: AC | PRN

## 2017-07-26 MED ORDER — IBUPROFEN 800 MG PO TABS: 800.0000 mg | ORAL_TABLET | Freq: Once | ORAL | Status: DC

## 2017-07-26 MED ORDER — AMOXICILLIN-POT CLAVULANATE 875-125 MG PO TABS: 1.0000 | ORAL_TABLET | Freq: Two times a day (BID) | ORAL | 0 refills | Status: AC

## 2017-07-26 MED ORDER — DEXAMETHASONE SODIUM PHOSPHATE 10 MG/ML IJ SOLN
10.0000 mg | Freq: Once | INTRAMUSCULAR | Status: AC
  Administered 2017-07-26: 10 mg via INTRAMUSCULAR
  Filled 2017-07-26: qty 1

## 2017-07-26 NOTE — Discharge Instructions (Signed)
It was my pleasure taking care of you today!   Please take all of your antibiotics until finished!   Alternate between tylenol and ibuprofen every 4 hours as needed for fevers.   Return to the Emergency Department if you develop any of the following symptoms:  No improvement in 3 days. Trouble breathing or swallowing. You keep throwing up and can't keep fluids down. You pass bloody or black tarry stools.  There is bright red blood in the stool. You have any questions or concerns.

## 2017-07-26 NOTE — ED Notes (Signed)
Pt has urine culture in lab if needed.  

## 2017-07-26 NOTE — ED Provider Notes (Signed)
[facility]-EMERGENCY DEPT Provider Note   CSN: [id number] Arrival date & time: 07/25/17  1913     History   Chief Complaint Chief Complaint  Patient presents with  . Emesis  . Sore Throat  . Diarrhea  . Fever    HPI Allison James is a 37 y.o. female.  The history is provided by the patient and medical records. No language interpreter was used.  Emesis   Associated symptoms include cough, diarrhea and a fever. Pertinent negatives include no abdominal pain.  Sore Throat  Pertinent negatives include no chest pain, no abdominal pain and no shortness of breath.  Diarrhea   Associated symptoms include cough. Pertinent negatives include no abdominal pain.  Fever   Associated symptoms include diarrhea, congestion, sore throat and cough. Pertinent negatives include no chest pain.   Allison James is a 37 y.o. female  with a PMH of HTN, obesity who presents to the Emergency Department complaining of persistent sore throat which began yesterday.  Associated symptoms include subjective fever, dry cough, nausea and nonbloody diarrhea.  She took Tylenol which did help with her feelings of fever.  Denies known sick contacts.  No neck pain or shortness of breath. No abdominal pain, urinary symptoms, back pain.    Past Medical History:  Diagnosis Date  . Hypertension during pregnancy   . Morbid obesity (HCC)     There are no active problems to display for this patient.   Past Surgical History:  Procedure Laterality Date  . CESAREAN SECTION  x 3  . TUBAL LIGATION       OB History   None      Home Medications    Prior to Admission medications   Medication Sig Start Date End Date Taking? Authorizing Provider  amoxicillin-clavulanate (AUGMENTIN) 875-125 MG tablet Take 1 tablet by mouth every 12 (twelve) hours. 07/26/17   Evadene Wardrip, Chase PicketJaime Pilcher, PA-C  ondansetron (ZOFRAN ODT) 4 MG disintegrating tablet Take 1 tablet (4 mg total) by mouth every 8 (eight) hours as needed  for nausea or vomiting. 07/26/17   Joannah Gitlin, Chase PicketJaime Pilcher, PA-C    Family History No family history on file.  Social History Social History   Tobacco Use  . Smoking status: Current Every Day Smoker    Packs/day: 1.00    Types: Cigarettes  . Smokeless tobacco: Never Used  Substance Use Topics  . Alcohol use: Yes    Comment: socially  . Drug use: Yes    Types: Marijuana     Allergies   Patient has no known allergies.   Review of Systems Review of Systems  Constitutional: Positive for fever.  HENT: Positive for congestion and sore throat.   Respiratory: Positive for cough. Negative for shortness of breath.   Cardiovascular: Negative for chest pain.  Gastrointestinal: Positive for diarrhea and nausea. Negative for abdominal pain.  Skin: Negative for rash.     Physical Exam Updated Vital Signs BP (!) 135/92   Pulse (!) 106   Temp 99.2 F (37.3 C) (Oral)   Resp 16   SpO2 93%   Physical Exam  Constitutional: She is oriented to person, place, and time. She appears well-developed and well-nourished. No distress.  HENT:  Head: Normocephalic and atraumatic.  OP with erythema, exudates and tonsillar hypertrophy. Handling secretions fine. Speaking in full sentences without difficulty.  Neck: Normal range of motion. Neck supple.  No meningeal signs.   Cardiovascular: Normal rate, regular rhythm and normal heart sounds.  Pulmonary/Chest: Effort  normal.  Lungs are clear to auscultation bilaterally - no w/r/r  Abdominal: Soft. She exhibits no distension. There is no tenderness.  Musculoskeletal: Normal range of motion.  Neurological: She is alert and oriented to person, place, and time.  Skin: Skin is warm and dry. She is not diaphoretic.  Nursing note and vitals reviewed.    ED Treatments / Results  Labs (all labs ordered are listed, but only abnormal results are displayed) Labs Reviewed  COMPREHENSIVE METABOLIC PANEL - Abnormal; Notable for the following components:       Result Value   Glucose, Bld 154 (*)    All other components within normal limits  CBC - Abnormal; Notable for the following components:   WBC 18.3 (*)    RDW 15.8 (*)    All other components within normal limits  URINALYSIS, ROUTINE W REFLEX MICROSCOPIC - Abnormal; Notable for the following components:   Color, Urine AMBER (*)    APPearance HAZY (*)    Hgb urine dipstick MODERATE (*)    Protein, ur 30 (*)    Leukocytes, UA SMALL (*)    Bacteria, UA RARE (*)    Squamous Epithelial / LPF 6-30 (*)    All other components within normal limits  I-STAT BETA HCG BLOOD, ED ([facility], [facility], [facility] ONLY) - Abnormal; Notable for the following components:   I-stat hCG, quantitative 15.3 (*)    All other components within normal limits  RAPID STREP SCREEN (NOT AT [facility])  CULTURE, GROUP A STREP [facility])  URINE CULTURE  LIPASE, BLOOD    EKG None  Radiology No results found.  Procedures Procedures (including critical care time)  Medications Ordered in ED Medications  ibuprofen (ADVIL,MOTRIN) tablet 800 mg (0 mg Oral Hold 07/26/17 0119)  acetaminophen (TYLENOL) tablet 650 mg (650 mg Oral Given 07/26/17 0111)  dexamethasone (DECADRON) injection 10 mg (10 mg Intramuscular Given 07/26/17 0143)     Initial Impression / Assessment and Plan / ED Course  I have reviewed the triage vital signs and the nursing notes.  Pertinent labs & imaging results that were available during my care of the patient were reviewed by me and considered in my medical decision making (see chart for details).    Allison James is a 37 y.o. female who presents to ED for sore throat, nausea, diarrhea. Temperature of 102.6 and tachycardic upon arrival. Temperature and HR trending downward appropriately with anti-pyretics.  On exam, oropharynx with erythema, exudates and tonsillar hypertrophy.  She is tolerating secretions well and tolerating p.o. in ED today.  Her lungs are clear to auscultation.  Labs reviewed and does show a leukocytosis  of 18.3.  She does not have any urinary symptoms today.  Small amount of leukocytes and 6-30 white cells noted.  Urine culture sent, but given asymptomatic with rare bacteria and 6-30 squamous, will hold treatment at this time.  HCG i-STAT of 15.3 noted.  She is currently on her menstrual cycle.  Given such a low value and on her cycle, likely false positive.  Will treat with Augmentin and have patient follow-up with PCP.  Reasons to return to ER were discussed with patient at length and all questions were answered.   Final Clinical Impressions(s) / ED Diagnoses   Final diagnoses:  Cough  Exudative pharyngitis  Diarrhea, unspecified type    ED Discharge Orders        Ordered    amoxicillin-clavulanate (AUGMENTIN) 875-125 MG tablet  Every 12 hours     07/26/17 0245  ondansetron (ZOFRAN ODT) 4 MG disintegrating tablet  Every 8 hours PRN     07/26/17 0245       Naesha Buckalew, Chase Picket, PA-C 07/26/17 0251    Devoria Albe, MD 07/26/17 [phone]

## 2017-07-27 LAB — URINE CULTURE

## 2017-07-27 LAB — CULTURE, GROUP A STREP (THRC)

## 2018-06-04 ENCOUNTER — Emergency Department (HOSPITAL_COMMUNITY)
Admission: EM | Admit: 2018-06-04 | Discharge: 2018-06-04 | Disposition: A | Discharge status: Home / Self Care | Payer: BLUE CROSS/BLUE SHIELD | Attending: Emergency Medicine | Admitting: Emergency Medicine

## 2018-06-04 ENCOUNTER — Other Ambulatory Visit: Payer: Self-pay

## 2018-06-04 ENCOUNTER — Encounter (HOSPITAL_COMMUNITY): Payer: Self-pay

## 2018-06-04 ENCOUNTER — Emergency Department (HOSPITAL_COMMUNITY): Payer: BLUE CROSS/BLUE SHIELD

## 2018-06-04 DIAGNOSIS — M1712 Unilateral primary osteoarthritis, left knee: Secondary | ICD-10-CM | POA: Insufficient documentation

## 2018-06-04 DIAGNOSIS — S8002XA Contusion of left knee, initial encounter: Secondary | ICD-10-CM | POA: Diagnosis not present

## 2018-06-04 DIAGNOSIS — Z79899 Other long term (current) drug therapy: Secondary | ICD-10-CM | POA: Diagnosis not present

## 2018-06-04 DIAGNOSIS — S8992XA Unspecified injury of left lower leg, initial encounter: Secondary | ICD-10-CM | POA: Diagnosis present

## 2018-06-04 DIAGNOSIS — W0110XA Fall on same level from slipping, tripping and stumbling with subsequent striking against unspecified object, initial encounter: Secondary | ICD-10-CM | POA: Insufficient documentation

## 2018-06-04 DIAGNOSIS — Y92512 Supermarket, store or market as the place of occurrence of the external cause: Secondary | ICD-10-CM | POA: Diagnosis not present

## 2018-06-04 DIAGNOSIS — Y999 Unspecified external cause status: Secondary | ICD-10-CM | POA: Diagnosis not present

## 2018-06-04 DIAGNOSIS — M25562 Pain in left knee: Secondary | ICD-10-CM

## 2018-06-04 DIAGNOSIS — Y9301 Activity, walking, marching and hiking: Secondary | ICD-10-CM | POA: Diagnosis not present

## 2018-06-04 DIAGNOSIS — F17210 Nicotine dependence, cigarettes, uncomplicated: Secondary | ICD-10-CM | POA: Diagnosis not present

## 2018-06-04 NOTE — Discharge Instructions (Signed)
Wear knee sleeve for at least 2 weeks for stabilization of knee. Use crutches as needed for comfort. Ice and elevate knee throughout the day, using ice pack for no more than 20 minutes every hour. Alternate between tylenol and ibuprofen as needed for pain. Call orthopedic follow up today or tomorrow to schedule followup appointment for recheck of ongoing knee pain in 1-2 weeks. Return to the ER for changes or worsening symptoms.

## 2018-06-04 NOTE — Progress Notes (Signed)
Orthopedic Tech Progress Note Patient Details:  Allison James [birth year] [id number] Pt did not knee sleeve that was ordered because she wasn't able to fit it. She was given an ace to go home and put on by herself due to pants not able to go all the way past her knee. Ortho Devices Type of Ortho Device: Crutches, Ace wrap Ortho Device/Splint Location: Lt knee Ortho Device/Splint Interventions: Adjustment   Post Interventions Patient Tolerated: Well Instructions Provided: Adjustment of device, Care of device   Tawni Carnes [facility] 06/04/2018, 2:48 PM

## 2018-06-04 NOTE — ED Triage Notes (Signed)
Pt states she slipped and fell in [employer] today, injuring left knee.

## 2018-06-04 NOTE — ED Provider Notes (Signed)
[facility]-EMERGENCY DEPT Provider Note   CSN: [id number] Arrival date & time: 06/04/18  1238     History   Chief Complaint Chief Complaint  Patient presents with  . Knee Pain    left    HPI Allison James is a 38 y.o. female with a PMHx of morbid obesity and pregnancy induced HTN, who presents to the ED with complaints of mechanical fall that occurred around 12 PM, approximately 2.5 hours prior to evaluation, resulting in left knee injury.  Patient states that she slipped in [employer] and fell on her left knee.  She describes the pain as 8/10 constant aching and throbbing nonradiating left knee pain that worsens with movement and has been minimally improved with ice.  She reports associated swelling.  She denies head injury, LOC, bruises, abrasions, numbness, tingling, focal weakness, or any other complaints or injuries at this time.  She doesn't have an orthopedist that she sees.   The history is provided by the patient and medical records. No language interpreter was used.  Knee Pain    Past Medical History:  Diagnosis Date  . Hypertension during pregnancy   . Morbid obesity (HCC)     There are no active problems to display for this patient.   Past Surgical History:  Procedure Laterality Date  . CESAREAN SECTION  x 3  . TUBAL LIGATION       OB History   No obstetric history on file.      Home Medications    Prior to Admission medications   Medication Sig Start Date End Date Taking? Authorizing Provider  amoxicillin-clavulanate (AUGMENTIN) 875-125 MG tablet Take 1 tablet by mouth every 12 (twelve) hours. 07/26/17   Ward, Chase Picket, PA-C  ondansetron (ZOFRAN ODT) 4 MG disintegrating tablet Take 1 tablet (4 mg total) by mouth every 8 (eight) hours as needed for nausea or vomiting. 07/26/17   Ward, Chase Picket, PA-C    Family History No family history on file.  Social History Social History   Tobacco Use  . Smoking status: Current Every Day  Smoker    Packs/day: 1.00    Types: Cigarettes  . Smokeless tobacco: Never Used  Substance Use Topics  . Alcohol use: Yes    Comment: socially  . Drug use: Yes    Types: Marijuana     Allergies   Patient has no known allergies.   Review of Systems Review of Systems  HENT: Negative for facial swelling (no head inj).   Musculoskeletal: Positive for arthralgias and joint swelling.  Skin: Negative for color change and wound.  Allergic/Immunologic: Negative for immunocompromised state.  Neurological: Negative for syncope, weakness and numbness.     Physical Exam Updated Vital Signs BP 131/90 (BP Location: Right Arm)   Pulse 90   Temp 98.4 F (36.9 C) (Oral)   Resp 16   Ht 5\' 2"  (1.575 m)   Wt 133.4 kg   LMP 04/03/2018   SpO2 95%   BMI 53.77 kg/m   Physical Exam Vitals signs and nursing note reviewed.  Constitutional:      General: She is not in acute distress.    Appearance: Normal appearance. She is well-developed. She is not toxic-appearing.     Comments: Afebrile, nontoxic, NAD  HENT:     Head: Normocephalic and atraumatic.  Eyes:     General:        Right eye: No discharge.        Left eye: No  discharge.     Conjunctiva/sclera: Conjunctivae normal.  Neck:     Musculoskeletal: Normal range of motion and neck supple.  Cardiovascular:     Rate and Rhythm: Normal rate.     Pulses: Normal pulses.  Pulmonary:     Effort: Pulmonary effort is normal. No respiratory distress.  Abdominal:     General: There is no distension.  Musculoskeletal: Normal range of motion.     Left knee: She exhibits normal range of motion, no swelling, no effusion, no ecchymosis, no deformity, no laceration, no erythema, normal alignment, no LCL laxity and no MCL laxity. Tenderness found. Medial joint line and lateral joint line tenderness noted.     Comments: L knee with FROM intact, with diffuse joint line TTP, no swelling/effusion, no crepitus or deformity, no bruising or erythema,  no warmth, no abnormal alignment or patellar mobility, no varus/valgus laxity, neg anterior drawer test. Able to bear some weight through the leg. Strength and sensation grossly intact, distal pulses intact, compartments soft   Skin:    General: Skin is warm and dry.     Findings: No rash.  Neurological:     Mental Status: She is alert and oriented to person, place, and time.     Sensory: Sensation is intact. No sensory deficit.     Motor: Motor function is intact.  Psychiatric:        Mood and Affect: Mood and affect normal.        Behavior: Behavior normal.      ED Treatments / Results  Labs (all labs ordered are listed, but only abnormal results are displayed) Labs Reviewed - No data to display  EKG None  Radiology Dg Knee Complete 4 Views Left  Result Date: 06/04/2018 CLINICAL DATA:  38 year old female with history of trauma from a fall. EXAM: LEFT KNEE - COMPLETE 4+ VIEW COMPARISON:  No priors. FINDINGS: No evidence of fracture, dislocation, or joint effusion. Mild joint space narrowing, subchondral sclerosis and osteophyte formation, most evident in the lateral compartment. No other focal bone abnormality. Soft tissues are unremarkable. IMPRESSION: 1. No acute radiographic abnormality of the left knee. 2. Degenerative changes of mild osteoarthritis, most severe in the lateral compartment. Electronically Signed   By: Trudie Reedaniel  Entrikin M.D.   On: 06/04/2018 13:50    Procedures Procedures (including critical care time)  Medications Ordered in ED Medications - No data to display   Initial Impression / Assessment and Plan / ED Course  I have reviewed the triage vital signs and the nursing notes.  Pertinent labs & imaging results that were available during my care of the patient were reviewed by me and considered in my medical decision making (see chart for details).     38 y.o. female with mechanical fall onto left knee, resulting in left knee pain.  On exam, patient able to  bear some weight through the leg, mild diffuse joint line tenderness, no swelling or bruising, no other areas of tenderness, neurovascularly intact with soft compartments.  X-ray obtained in triage negative for acute findings, does show some degenerative changes of mild osteoarthritis most severe in the lateral compartment.  I suspect this is playing a part in why she hurts.  Doubt occult tibial plateau fracture.  Will give knee sleeve and crutches for comfort, advised RICE, Tylenol, Motrin for pain.  Follow-up with orthopedist in 1-2 weeks. I explained the diagnosis and have given explicit precautions to return to the ER including for any other new or worsening  symptoms. The patient understands and accepts the medical plan as it's been dictated and I have answered their questions. Discharge instructions concerning home care and prescriptions have been given. The patient is STABLE and is discharged to home in good condition.    Final Clinical Impressions(s) / ED Diagnoses   Final diagnoses:  Acute pain of left knee  Contusion of left knee, initial encounter  Arthritis of left knee    ED Discharge Orders    [address]       Missie Gehrig, CaseyMercedes, New JerseyPA-C 06/04/18 1423    Samuel JesterMcManus, Kathleen, DO 06/05/18 [phone]

## 2018-10-12 ENCOUNTER — Emergency Department (HOSPITAL_COMMUNITY)
Admission: EM | Admit: 2018-10-12 | Discharge: 2018-10-12 | Disposition: A | Discharge status: Home / Self Care | Payer: BC Managed Care – PPO | Attending: Emergency Medicine | Admitting: Emergency Medicine

## 2018-10-12 ENCOUNTER — Encounter (HOSPITAL_COMMUNITY): Payer: Self-pay

## 2018-10-12 ENCOUNTER — Other Ambulatory Visit: Payer: Self-pay

## 2018-10-12 ENCOUNTER — Emergency Department (HOSPITAL_COMMUNITY): Payer: BC Managed Care – PPO

## 2018-10-12 DIAGNOSIS — Z20822 Contact with and (suspected) exposure to covid-19: Secondary | ICD-10-CM

## 2018-10-12 DIAGNOSIS — R0602 Shortness of breath: Secondary | ICD-10-CM | POA: Diagnosis present

## 2018-10-12 DIAGNOSIS — Z20828 Contact with and (suspected) exposure to other viral communicable diseases: Secondary | ICD-10-CM | POA: Diagnosis not present

## 2018-10-12 DIAGNOSIS — R05 Cough: Secondary | ICD-10-CM | POA: Diagnosis not present

## 2018-10-12 DIAGNOSIS — F17210 Nicotine dependence, cigarettes, uncomplicated: Secondary | ICD-10-CM | POA: Insufficient documentation

## 2018-10-12 DIAGNOSIS — J45901 Unspecified asthma with (acute) exacerbation: Secondary | ICD-10-CM | POA: Diagnosis not present

## 2018-10-12 DIAGNOSIS — R059 Cough, unspecified: Secondary | ICD-10-CM

## 2018-10-12 HISTORY — DX: Sleep apnea, unspecified: G47.30

## 2018-10-12 HISTORY — DX: Unspecified asthma, uncomplicated: J45.909

## 2018-10-12 HISTORY — DX: Unspecified convulsions: R56.9

## 2018-10-12 MED ORDER — PREDNISONE 20 MG PO TABS
60.0000 mg | ORAL_TABLET | Freq: Once | ORAL | Status: AC
  Administered 2018-10-12: 60 mg via ORAL
  Filled 2018-10-12: qty 3

## 2018-10-12 MED ORDER — PREDNISONE 20 MG PO TABS: 20.0000 mg | ORAL_TABLET | Freq: Every day | ORAL | 0 refills | Status: AC

## 2018-10-12 MED ORDER — BENZONATATE 100 MG PO CAPS: 100.0000 mg | ORAL_CAPSULE | Freq: Three times a day (TID) | ORAL | 0 refills | Status: AC

## 2018-10-12 MED ORDER — ALBUTEROL SULFATE HFA 108 (90 BASE) MCG/ACT IN AERS
8.0000 | INHALATION_SPRAY | Freq: Once | RESPIRATORY_TRACT | Status: AC
  Administered 2018-10-12: 8 via RESPIRATORY_TRACT
  Filled 2018-10-12: qty 6.7

## 2018-10-12 NOTE — ED Notes (Signed)
Patient given discharge teaching and verbalized understanding. Patient ambulated out of ED with a steady gait. 

## 2018-10-12 NOTE — Discharge Instructions (Addendum)
Take Prednisone until completed. Take Tessalon every 8 hours as needed for cough. Please stay quarantined at home until your symptoms have been resolved for 3 days and you have had 7 days of symptoms. You will be called in 24-48 hours with your COVID-19 test results. Please return to the emergency department if you develop any new or worsening symptoms, including worsening shortness of breath, passing out, fingers or lips turning blue, or any other concerning symptoms.

## 2018-10-12 NOTE — ED Provider Notes (Signed)
[facility]-EMERGENCY DEPT Provider Note   CSN: [id number] Arrival date & time: 10/12/18  0818    History   Chief Complaint Chief Complaint  Patient presents with  . Shortness of Breath    HPI Allison James is a 38 y.o. female with history of asthma, morbid obesity, seizures who presents with a 5-day history of cough and shortness of breath.  Her cough is productive.  She states it was initially yellow, but has now been white and foamy.  Patient reports she has had wheezing at home which has been improved with albuterol nebulizer treatments.  Patient denies any chest pain, fever, abdominal pain, nausea, vomiting.  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     HPI  Past Medical History:  Diagnosis Date  . Asthma   . Hypertension during pregnancy   . Morbid obesity (HCC)   . Seizures (HCC)   . Sleep apnea     There are no active problems to display for this patient.   Past Surgical History:  Procedure Laterality Date  . CESAREAN SECTION  x 3  . TUBAL LIGATION       OB History   No obstetric history on file.      Home Medications    Prior to Admission medications   Medication Sig Start Date End Date Taking? Authorizing Provider  amoxicillin-clavulanate (AUGMENTIN) 875-125 MG tablet Take 1 tablet by mouth every 12 (twelve) hours. 07/26/17   Ward, Chase PicketJaime Pilcher, PA-C  benzonatate (TESSALON) 100 MG capsule Take 1 capsule (100 mg total) by mouth every 8 (eight) hours. 10/12/18   Berneita Sanagustin, Waylan BogaAlexandra M, PA-C  ondansetron (ZOFRAN ODT) 4 MG disintegrating tablet Take 1 tablet (4 mg total) by mouth every 8 (eight) hours as needed for nausea or vomiting. 07/26/17   Ward, Chase PicketJaime Pilcher, PA-C  predniSONE (DELTASONE) 20 MG tablet Take 1 tablet (20 mg total) by mouth daily. 10/12/18   Emi HolesLaw, Areona Homer M, PA-C    Family History Family History  Problem Relation Age of Onset  . Hypertension Mother   . Diabetes Mother   .  Pancreatitis Mother   . Kidney failure Mother   . Stroke Mother     Social History Social History   Tobacco Use  . Smoking status: Current Every Day Smoker    Packs/day: 1.00    Types: Cigarettes  . Smokeless tobacco: Never Used  Substance Use Topics  . Alcohol use: Yes    Comment: socially  . Drug use: Yes    Types: Marijuana     Allergies   Patient has no known allergies.   Review of Systems Review of Systems  Constitutional: Negative for chills and fever.  HENT: Negative for facial swelling and sore throat.   Respiratory: Positive for cough, shortness of breath and wheezing.   Cardiovascular: Negative for chest pain.  Gastrointestinal: Negative for abdominal pain, nausea and vomiting.  Genitourinary: Negative for dysuria.  Musculoskeletal: Negative for back pain.  Skin: Negative for rash and wound.  Neurological: Negative for headaches.  Psychiatric/Behavioral: The patient is not nervous/anxious.      Physical Exam Updated Vital Signs BP (!) 144/86 (BP Location: Left Arm)   Pulse 100   Temp 98.8 F (37.1 C) (Oral)   Resp 16   Ht 5\' 3"  (1.6 m)   Wt 128.4 kg   LMP 10/11/2017   SpO2 94%   BMI 50.13 kg/m   Physical Exam Vitals signs  and nursing note reviewed.  Constitutional:      General: She is not in acute distress.    Appearance: She is well-developed. She is obese. She is not diaphoretic.  HENT:     Head: Normocephalic and atraumatic.     Mouth/Throat:     Mouth: Mucous membranes are moist.     Pharynx: No oropharyngeal exudate or posterior oropharyngeal erythema.  Eyes:     General: No scleral icterus.       Right eye: No discharge.        Left eye: No discharge.     Conjunctiva/sclera: Conjunctivae normal.     Pupils: Pupils are equal, round, and reactive to light.  Neck:     Musculoskeletal: Normal range of motion and neck supple.     Thyroid: No thyromegaly.  Cardiovascular:     Rate and Rhythm: Normal rate and regular rhythm.      Heart sounds: Normal heart sounds. No murmur. No friction rub. No gallop.   Pulmonary:     Effort: Pulmonary effort is normal. No respiratory distress.     Breath sounds: Normal breath sounds. No stridor. No wheezing or rales.  Abdominal:     General: Bowel sounds are normal. There is no distension.     Palpations: Abdomen is soft.     Tenderness: There is no abdominal tenderness. There is no guarding or rebound.  Lymphadenopathy:     Cervical: No cervical adenopathy.  Skin:    General: Skin is warm and dry.     Coloration: Skin is not pale.     Findings: No rash.  Neurological:     Mental Status: She is alert.     Coordination: Coordination normal.      ED Treatments / Results  Labs (all labs ordered are listed, but only abnormal results are displayed) Labs Reviewed  NOVEL CORONAVIRUS, NAA (HOSPITAL ORDER, SEND-OUT TO REF LAB)    EKG None  Radiology Dg Chest Portable 1 View  Result Date: 10/12/2018 CLINICAL DATA:  Shortness of breath, productive cough for 4 days. EXAM: PORTABLE CHEST 1 VIEW COMPARISON:  None. FINDINGS: The heart size and mediastinal contours are within normal limits. Both lungs are clear. The visualized skeletal structures are unremarkable. IMPRESSION: No active disease.  No evidence of pneumonia or pulmonary edema. Electronically Signed   By: Franki Cabot M.D.   On: 10/12/2018 09:58    Procedures Procedures (including critical care time)  Medications Ordered in ED Medications  albuterol (VENTOLIN HFA) 108 (90 Base) MCG/ACT inhaler 8 puff (8 puffs Inhalation Given 10/12/18 0927)  predniSONE (DELTASONE) tablet 60 mg (60 mg Oral Given 10/12/18 0925)     Initial Impression / Assessment and Plan / ED Course  I have reviewed the triage vital signs and the nursing notes.  Pertinent labs & imaging results that were available during my care of the patient were reviewed by me and considered in my medical decision making (see chart for details).         Patient presenting with 3 to 4-day history of cough and shortness of breath after exposure to coronavirus.  Send out COVID-19 test pending.  Patient ambulated in ED with O2 saturations maintained >95, no current signs of respiratory distress. Lung exam improved after albuterol inhaler treatment. Prednisone given in the ED and pt will bd dc with 5 day burst. Pt states they are breathing at baseline. Pt has been instructed to continue using prescribed medications and to speak with PCP about  today's exacerbation.  Will discharge home with Tessalon as well.  Patient advised to quarantine at home.  Return precautions discussed.  Patient understands and agrees with plan.  Patient vitals stable her ED course and discharged in satisfactory condition.   Final Clinical Impressions(s) / ED Diagnoses   Final diagnoses:  Exacerbation of asthma, unspecified asthma severity, unspecified whether persistent  Close Exposure to Covid-19 Virus  Shortness of breath  Cough    ED Discharge Orders         Ordered    predniSONE (DELTASONE) 20 MG tablet  Daily     10/12/18 1051    benzonatate (TESSALON) 100 MG capsule  Every 8 hours     10/12/18 1051           Dalyn Becker, ElmoAlexandra M, PA-C 10/12/18 1520    Samuel JesterMcManus, Kathleen, DO 10/16/18 1443

## 2018-10-12 NOTE — ED Triage Notes (Signed)
Patient c/o SOB  And a productive cough x 4 days. Patient states she was exposed to Covid -19. Patient states she was tested prior to being exposed. Patient denies abdominal pain, N/V/D, sore throat, or fever.

## 2018-10-13 LAB — NOVEL CORONAVIRUS, NAA (HOSP ORDER, SEND-OUT TO REF LAB; TAT 18-24 HRS): SARS-CoV-2, NAA: NOT DETECTED

## 2019-06-04 ENCOUNTER — Emergency Department (HOSPITAL_COMMUNITY)
Admission: EM | Admit: 2019-06-04 | Discharge: 2019-06-04 | Disposition: A | Discharge status: Home / Self Care | Payer: Medicaid - Out of State | Attending: Emergency Medicine | Admitting: Emergency Medicine

## 2019-06-04 ENCOUNTER — Encounter (HOSPITAL_COMMUNITY): Payer: Self-pay

## 2019-06-04 ENCOUNTER — Other Ambulatory Visit: Payer: Self-pay

## 2019-06-04 DIAGNOSIS — W01190A Fall on same level from slipping, tripping and stumbling with subsequent striking against furniture, initial encounter: Secondary | ICD-10-CM | POA: Diagnosis not present

## 2019-06-04 DIAGNOSIS — S0003XA Contusion of scalp, initial encounter: Secondary | ICD-10-CM | POA: Insufficient documentation

## 2019-06-04 DIAGNOSIS — S0990XA Unspecified injury of head, initial encounter: Secondary | ICD-10-CM

## 2019-06-04 DIAGNOSIS — Y9289 Other specified places as the place of occurrence of the external cause: Secondary | ICD-10-CM | POA: Insufficient documentation

## 2019-06-04 DIAGNOSIS — Y9389 Activity, other specified: Secondary | ICD-10-CM | POA: Diagnosis not present

## 2019-06-04 DIAGNOSIS — J45909 Unspecified asthma, uncomplicated: Secondary | ICD-10-CM | POA: Diagnosis not present

## 2019-06-04 DIAGNOSIS — Y999 Unspecified external cause status: Secondary | ICD-10-CM | POA: Insufficient documentation

## 2019-06-04 DIAGNOSIS — T148XXA Other injury of unspecified body region, initial encounter: Secondary | ICD-10-CM

## 2019-06-04 DIAGNOSIS — F17210 Nicotine dependence, cigarettes, uncomplicated: Secondary | ICD-10-CM | POA: Insufficient documentation

## 2019-06-04 NOTE — ED Provider Notes (Signed)
[facility]-EMERGENCY DEPT Provider Note   CSN: [id number] Arrival date & time: 06/04/19  1738     History Chief Complaint  Patient presents with  . Fall  . Head Injury    Allison James is a 39 y.o. female history of asthma, seizures, sleep apnea who presents for evaluation of head injury.  She reports that about 5 PM this evening, she tripped over a plastic bag, causing her to fall backwards and hit her head on a wood coffee table.  Patient reports that she did not have any LOC.  She is not on blood thinners.  She states initially after the accident, she felt slightly lightheaded but states that is since resolved.  She states she has not had any vision changes, nausea/vomiting, numbness/weakness of arms or legs, difficulty walking.  She denies any preceding chest pain.  The history is provided by the patient.       Past Medical History:  Diagnosis Date  . Asthma   . Hypertension during pregnancy   . Morbid obesity (HCC)   . Seizures (HCC)   . Sleep apnea     There are no problems to display for this patient.   Past Surgical History:  Procedure Laterality Date  . CESAREAN SECTION  x 3  . TUBAL LIGATION       OB History   No obstetric history on file.     Family History  Problem Relation Age of Onset  . Hypertension Mother   . Diabetes Mother   . Pancreatitis Mother   . Kidney failure Mother   . Stroke Mother     Social History   Tobacco Use  . Smoking status: Current Every Day Smoker    Packs/day: 0.50    Types: Cigarettes  . Smokeless tobacco: Never Used  Substance Use Topics  . Alcohol use: Yes    Comment: socially  . Drug use: Yes    Types: Marijuana    Home Medications Prior to Admission medications   Medication Sig Start Date End Date Taking? Authorizing Provider  amoxicillin-clavulanate (AUGMENTIN) 875-125 MG tablet Take 1 tablet by mouth every 12 (twelve) hours. 07/26/17   Ward, Chase Picket, PA-C  benzonatate (TESSALON) 100  MG capsule Take 1 capsule (100 mg total) by mouth every 8 (eight) hours. 10/12/18   Law, Waylan Boga, PA-C  ondansetron (ZOFRAN ODT) 4 MG disintegrating tablet Take 1 tablet (4 mg total) by mouth every 8 (eight) hours as needed for nausea or vomiting. 07/26/17   Ward, Chase Picket, PA-C  predniSONE (DELTASONE) 20 MG tablet Take 1 tablet (20 mg total) by mouth daily. 10/12/18   Emi Holes, PA-C    Allergies    Patient has no known allergies.  Review of Systems   Review of Systems  Eyes: Negative for visual disturbance.  Cardiovascular: Negative for chest pain.  Gastrointestinal: Negative for nausea and vomiting.  Neurological: Positive for light-headedness (resolved). Negative for seizures, weakness and headaches.  All other systems reviewed and are negative.   Physical Exam Updated Vital Signs BP (!) 138/95   Pulse 96   Temp 98.9 F (37.2 C) (Oral)   Resp 18   Ht 5\' 3"  (1.6 m)   Wt (!) 152 kg   LMP 05/28/2019 (Approximate)   SpO2 94%   BMI 59.36 kg/m   Physical Exam Vitals and nursing note reviewed.  Constitutional:      Appearance: She is well-developed.  HENT:     Head: Normocephalic.  Comments: Hematoma noted to the right posterior aspect of her head.  No underlying skull deformity or crepitus noted.  No Battle's, raccoon's sign. Eyes:     General: No scleral icterus.       Right eye: No discharge.        Left eye: No discharge.     Conjunctiva/sclera: Conjunctivae normal.     Comments: PERRL. EOMs intact. No nystagmus. No neglect.   Neck:     Comments: Full flexion/extension and lateral movement of neck fully intact. No bony midline tenderness. No deformities or crepitus.  Pulmonary:     Effort: Pulmonary effort is normal.  Skin:    General: Skin is warm and dry.  Neurological:     Mental Status: She is alert.     Comments: Cranial nerves III-XII intact Follows commands, Moves all extremities  5/5 strength to BUE and BLE  Sensation intact throughout  all major nerve distributions No pronator drift. No gait abnormalities  No slurred speech. No facial droop.   Psychiatric:        Speech: Speech normal.        Behavior: Behavior normal.     ED Results / Procedures / Treatments   Labs (all labs ordered are listed, but only abnormal results are displayed) Labs Reviewed - No data to display  EKG None  Radiology No results found.  Procedures Procedures (including critical care time)  Medications Ordered in ED Medications - No data to display  ED Course  I have reviewed the triage vital signs and the nursing notes.  Pertinent labs & imaging results that were available during my care of the patient were reviewed by me and considered in my medical decision making (see chart for details).    MDM Rules/Calculators/A&P                      39 year old female who presents for evaluation of head injury that occurred about 5 PM this afternoon.  She reports she tripped and fell backwards, hitting her head on a table.  No LOC.  She is not on blood thinners.  Initially felt lightheaded that since resolved.  Initially arrival, she is afebrile, nontoxic-appearing.  Vital signs are stable.  On exam, she has a small hematoma to the posterior aspect of her right head.  No underlying skull deformity or crepitus.  No neuro deficits noted. Given reassuring physical exam and per [facility] CT criteria, no imaging is indicated at this time.  I discussed at length with patient regarding her findings and her reassuring work-up.  Patient is reassured and does not want any imaging at this time.  I did offer her head CT though I told her based on her history/physical exam, it would not be warranted.  Patient was in agreement and did not wish to have any imaging.  At this time, I feel is reasonable given that her history/physical exam is not concerning for intracranial hemorrhage, skull fracture.  I did discuss with her that she may have some postconcussive  type symptoms that may last for 2 to 4 weeks.  Encouraged at home supportive care measures. At this time, patient exhibits no emergent life-threatening condition that require further evaluation in ED or admission. Patient had ample opportunity for questions and discussion. All patient's questions were answered with full understanding. Strict return precautions discussed. Patient expresses understanding and agreement to plan.   Portions of this note were generated with Scientist, clinical (histocompatibility and immunogenetics). Dictation errors may  occur despite best attempts at proofreading.  Final Clinical Impression(s) / ED Diagnoses Final diagnoses:  Minor head injury, initial encounter  Hematoma    Rx / DC Orders ED Discharge Orders    None       Volanda Napoleon, PA-C 06/05/19 0055    Margette Fast, MD 06/05/19 1019

## 2019-06-04 NOTE — Discharge Instructions (Signed)
As we discussed today, your exam was reassuring.  You do have a hematoma.  You can apply ice help with pain and swelling.  As we discussed, you may experience some postconcussion symptoms.  You need to refrain from any physical activity for 2 weeks.  The symptoms may last 2 to 4 weeks.  Return the emergency department for any vomiting, difficulty walking, numbness/weakness of your arms or legs, vision changes, difficulty speaking, any other worsening or concerning symptoms.

## 2019-06-04 NOTE — ED Triage Notes (Addendum)
Patient states she tripped over a plastic bag and fell hitting the back of her head on a wood coffee table. Patient has a hematoma and dizziness Patient denies LOC, blurred vision, or taking blood thinners.

## 2020-09-28 ENCOUNTER — Emergency Department (HOSPITAL_BASED_OUTPATIENT_CLINIC_OR_DEPARTMENT_OTHER): Payer: Self-pay

## 2020-09-28 ENCOUNTER — Encounter (HOSPITAL_BASED_OUTPATIENT_CLINIC_OR_DEPARTMENT_OTHER): Payer: Self-pay

## 2020-09-28 ENCOUNTER — Emergency Department (HOSPITAL_BASED_OUTPATIENT_CLINIC_OR_DEPARTMENT_OTHER)
Admission: EM | Admit: 2020-09-28 | Discharge: 2020-09-28 | Disposition: A | Discharge status: Home / Self Care | Payer: Self-pay | Attending: Emergency Medicine | Admitting: Emergency Medicine

## 2020-09-28 ENCOUNTER — Other Ambulatory Visit: Payer: Self-pay

## 2020-09-28 DIAGNOSIS — G40909 Epilepsy, unspecified, not intractable, without status epilepticus: Secondary | ICD-10-CM | POA: Insufficient documentation

## 2020-09-28 DIAGNOSIS — F17210 Nicotine dependence, cigarettes, uncomplicated: Secondary | ICD-10-CM | POA: Insufficient documentation

## 2020-09-28 DIAGNOSIS — J45909 Unspecified asthma, uncomplicated: Secondary | ICD-10-CM | POA: Insufficient documentation

## 2020-09-28 LAB — CBC
HCT: 43.8 % (ref 36.0–46.0)
Hemoglobin: 14.1 g/dL (ref 12.0–15.0)
MCH: 29 pg (ref 26.0–34.0)
MCHC: 32.2 g/dL (ref 30.0–36.0)
MCV: 90.1 fL (ref 80.0–100.0)
Platelets: 324 10*3/uL (ref 150–400)
RBC: 4.86 MIL/uL (ref 3.87–5.11)
RDW: 14.6 % (ref 11.5–15.5)
WBC: 7.8 10*3/uL (ref 4.0–10.5)
nRBC: 0 % (ref 0.0–0.2)

## 2020-09-28 LAB — I-STAT VENOUS BLOOD GAS, ED
Acid-Base Excess: 4 mmol/L — ABNORMAL HIGH (ref 0.0–2.0)
Bicarbonate: 31 mmol/L — ABNORMAL HIGH (ref 20.0–28.0)
Calcium, Ion: 1.27 mmol/L (ref 1.15–1.40)
HCT: 43 % (ref 36.0–46.0)
Hemoglobin: 14.6 g/dL (ref 12.0–15.0)
O2 Saturation: 44 %
Patient temperature: 98.7
Potassium: 3.8 mmol/L (ref 3.5–5.1)
Sodium: 141 mmol/L (ref 135–145)
TCO2: 33 mmol/L — ABNORMAL HIGH (ref 22–32)
pCO2, Ven: 57 mmHg (ref 44.0–60.0)
pH, Ven: 7.344 (ref 7.250–7.430)
pO2, Ven: 27 mmHg — CL (ref 32.0–45.0)

## 2020-09-28 LAB — BASIC METABOLIC PANEL
Anion gap: 9 (ref 5–15)
BUN: 11 mg/dL (ref 6–20)
CO2: 29 mmol/L (ref 22–32)
Calcium: 9.6 mg/dL (ref 8.9–10.3)
Chloride: 102 mmol/L (ref 98–111)
Creatinine, Ser: 0.81 mg/dL (ref 0.44–1.00)
GFR, Estimated: >60 mL/min (ref >60)
Glucose, Bld: 98 mg/dL (ref 70–99)
Potassium: 3.9 mmol/L (ref 3.5–5.1)
Sodium: 140 mmol/L (ref 135–145)

## 2020-09-28 LAB — PREGNANCY, URINE: Preg Test, Ur: NEGATIVE

## 2020-09-28 MED ORDER — KETOROLAC TROMETHAMINE 30 MG/ML IJ SOLN
15.0000 mg | Freq: Once | INTRAMUSCULAR | Status: AC
  Administered 2020-09-28: 15 mg via INTRAVENOUS
  Filled 2020-09-28: qty 1

## 2020-09-28 NOTE — ED Provider Notes (Signed)
[facility] EMERGENCY DEPT Provider Note   CSN: [id number] Arrival date & time: 09/28/20  2043     History Chief Complaint  Patient presents with  . Seizures  . Headache    Allison James is a 40 y.o. female.  HPI   Patient presented to the ED for evaluation of absence seizure's.  Patient states she has a history of these.  Patient states she has had 3 today which is unusual for her.  Patient states the first 2 occurred while she was awake.  Patient it is hard to describe but it is like she is having an out of body experience.  Following that episode she feels very weak and fatigued.  She also has a headache.  This occurred twice today.  Patient states the last episode occurred while she was asleep.  Patient states she is able to tell that she has 1 while she is asleep.  She did not have any tonic-clonic seizure activity.  Patient states she did have seizures in the past and was admitted to the hospital once.  She had an evaluation by a neurologist but was referred back to her primary care doctor and does not regularly see a neurologist.  She takes gabapentin for the absence seizure's.  Patient denies any fevers or chills.  She does not have any focal numbness or weakness.  No chest pain or shortness of breath.  Past Medical History:  Diagnosis Date  . Asthma   . Hypertension during pregnancy   . Morbid obesity (HCC)   . Seizures (HCC)   . Sleep apnea     There are no problems to display for this patient.   Past Surgical History:  Procedure Laterality Date  . CESAREAN SECTION  x 3  . TUBAL LIGATION       OB History   No obstetric history on file.     Family History  Problem Relation Age of Onset  . Hypertension Mother   . Diabetes Mother   . Pancreatitis Mother   . Kidney failure Mother   . Stroke Mother     Social History   Tobacco Use  . Smoking status: Current Every Day Smoker    Packs/day: 0.50    Types: Cigarettes  . Smokeless tobacco: Never  Used  Vaping Use  . Vaping Use: Never used  Substance Use Topics  . Alcohol use: Yes    Comment: socially  . Drug use: Yes    Types: Marijuana    Home Medications Prior to Admission medications   Medication Sig Start Date End Date Taking? Authorizing Provider  amoxicillin-clavulanate (AUGMENTIN) 875-125 MG tablet Take 1 tablet by mouth every 12 (twelve) hours. 07/26/17   Ward, Chase Picket, PA-C  benzonatate (TESSALON) 100 MG capsule Take 1 capsule (100 mg total) by mouth every 8 (eight) hours. 10/12/18   Law, Waylan Boga, PA-C  ondansetron (ZOFRAN ODT) 4 MG disintegrating tablet Take 1 tablet (4 mg total) by mouth every 8 (eight) hours as needed for nausea or vomiting. 07/26/17   Ward, Chase Picket, PA-C  predniSONE (DELTASONE) 20 MG tablet Take 1 tablet (20 mg total) by mouth daily. 10/12/18   Emi Holes, PA-C    Allergies    Patient has no known allergies.  Review of Systems   Review of Systems  All other systems reviewed and are negative.   Physical Exam Updated Vital Signs BP (!) 155/85   Pulse 92   Temp 98.7 F (37.1 C)  Resp 16   Ht 1.6 m (5\' 3" )   Wt (!) 150.3 kg   LMP 09/07/2020   SpO2 93%   BMI 58.70 kg/m   Physical Exam Vitals and nursing note reviewed.  Constitutional:      General: She is not in acute distress.    Appearance: She is well-developed.     Comments: Elevated BMI  HENT:     Head: Normocephalic and atraumatic.     Right Ear: External ear normal.     Left Ear: External ear normal.  Eyes:     General: No visual field deficit or scleral icterus.       Right eye: No discharge.        Left eye: No discharge.     Conjunctiva/sclera: Conjunctivae normal.  Neck:     Trachea: No tracheal deviation.  Cardiovascular:     Rate and Rhythm: Normal rate and regular rhythm.  Pulmonary:     Effort: Pulmonary effort is normal. No respiratory distress.     Breath sounds: Normal breath sounds. No stridor. No wheezing or rales.  Abdominal:      General: Bowel sounds are normal. There is no distension.     Palpations: Abdomen is soft.     Tenderness: There is no abdominal tenderness. There is no guarding or rebound.  Musculoskeletal:        General: No tenderness.     Cervical back: Neck supple.  Skin:    General: Skin is warm and dry.     Findings: No rash.  Neurological:     Mental Status: She is alert.     GCS: GCS eye subscore is 4. GCS verbal subscore is 5. GCS motor subscore is 6.     Cranial Nerves: No cranial nerve deficit (no facial droop, extraocular movements intact, no slurred speech) or dysarthria.     Sensory: No sensory deficit.     Motor: No weakness, abnormal muscle tone or seizure activity.     Coordination: Coordination normal.     Comments: 5 out of 5 strength bilateral upper and lower extremities, normal finger-nose exam, no facial droop, normal speech     ED Results / Procedures / Treatments   Labs (all labs ordered are listed, but only abnormal results are displayed) Labs Reviewed  I-STAT VENOUS BLOOD GAS, ED - Abnormal; Notable for the following components:      Result Value   pO2, Ven 27.0 (*)    Bicarbonate 31.0 (*)    TCO2 33 (*)    Acid-Base Excess 4.0 (*)    All other components within normal limits  CBC  BASIC METABOLIC PANEL  PREGNANCY, URINE  BLOOD GAS, VENOUS    EKG EKG Interpretation  Date/Time:  Sunday September 28 2020 21:34:24 EDT Ventricular Rate:  73 PR Interval:  151 QRS Duration: 96 QT Interval:  367 QTC Calculation: 405 R Axis:   3 Text Interpretation: Sinus rhythm Low voltage, precordial leads Borderline T abnormalities, diffuse leads Since last tracing rate slower Confirmed by [id number] [phone]) on 09/28/2020 9:36:18 PM   Radiology CT Head Wo Contrast  Result Date: 09/28/2020 CLINICAL DATA:  Seizures EXAM: CT HEAD WITHOUT CONTRAST TECHNIQUE: Contiguous axial images were obtained from the base of the skull through the vertex without intravenous contrast. COMPARISON:   None. FINDINGS: Brain: There is no mass, hemorrhage or extra-axial collection. The size and configuration of the ventricles and extra-axial CSF spaces are normal. The brain parenchyma is normal, without acute or  chronic infarction. Vascular: No abnormal hyperdensity of the major intracranial arteries or dural venous sinuses. No intracranial atherosclerosis. Skull: The visualized skull base, calvarium and extracranial soft tissues are normal. Sinuses/Orbits: No fluid levels or advanced mucosal thickening of the visualized paranasal sinuses. No mastoid or middle ear effusion. The orbits are normal. IMPRESSION: Normal head CT. Electronically Signed   By: Deatra Robinson M.D.   On: 09/28/2020 21:35    Procedures Procedures   Medications Ordered in ED Medications  ketorolac (TORADOL) 30 MG/ML injection 15 mg (15 mg Intravenous Given 09/28/20 2156)    ED Course  I have reviewed the triage vital signs and the nursing notes.  Pertinent labs & imaging results that were available during my care of the patient were reviewed by me and considered in my medical decision making (see chart for details).  Clinical Course as of 09/28/20 2217  Wynelle Link Sep 28, 2020  2145 No acidosis noted on VBG [JK]  2203 Metabolic panel normal [JK]  2204 CBC normal [JK]  2204 Pregnancy test negative. [JK]  2204 Head CT without acute findings [JK]    Clinical Course User Index [JK] Linwood Dibbles, MD   MDM Rules/Calculators/A&P                          Patient presented to the ED for evaluation of possible absence seizure's.  Patient's history is somewhat atypical.  Patient states she did have tonic-clonic seizures many years ago.  She is not seeing a neurologist.  Patient describes several episodes today that she has attributed to absence seizure's.  Her ED work-up is reassuring.  No acute findings on head CT.  Laboratory tests are unremarkable.  Patient has normal mental status here and no findings to suggest acute infection or  stroke.  At this point I think she is stable for discharge but I do recommend close outpatient follow-up with neurology.  She may benefit from outpatient EEG testing Final Clinical Impression(s) / ED Diagnoses Final diagnoses:  Seizure disorder (HCC)    Rx / DC Orders ED Discharge Orders         Ordered    Ambulatory referral to Neurology       Comments: An appointment is requested in approximately: 2 weeks   09/28/20 2217           Linwood Dibbles, MD 09/28/20 2218

## 2020-09-28 NOTE — Discharge Instructions (Signed)
Continue your current medications.  Follow-up with a neurologist for further evaluation.  Return to the ED as needed for worsening symptoms

## 2020-09-28 NOTE — ED Triage Notes (Addendum)
Pt states she has had three "absent seizures" today which is abnormal for her. Her last seizure happened PTA. Pt states they typically last about three minutes and she feels weak, tired, and has a headache post-ictal. Pt currently c/o frontal headache. Pt only takes 300 mg Gabapentin PO hs for her seizures. Pt was told by her PCP that she has "absent seizures" due to her sleep apnea.    Pt also states she has been under more stress than usual.

## 2020-11-24 ENCOUNTER — Encounter (HOSPITAL_BASED_OUTPATIENT_CLINIC_OR_DEPARTMENT_OTHER): Payer: Self-pay | Admitting: Obstetrics and Gynecology

## 2020-11-24 ENCOUNTER — Other Ambulatory Visit (HOSPITAL_BASED_OUTPATIENT_CLINIC_OR_DEPARTMENT_OTHER): Payer: Self-pay

## 2020-11-24 ENCOUNTER — Other Ambulatory Visit: Payer: Self-pay

## 2020-11-24 ENCOUNTER — Emergency Department (HOSPITAL_BASED_OUTPATIENT_CLINIC_OR_DEPARTMENT_OTHER)
Admission: EM | Admit: 2020-11-24 | Discharge: 2020-11-24 | Disposition: A | Discharge status: Home / Self Care | Payer: Medicaid - Out of State | Attending: Emergency Medicine | Admitting: Emergency Medicine

## 2020-11-24 DIAGNOSIS — U071 COVID-19: Secondary | ICD-10-CM | POA: Insufficient documentation

## 2020-11-24 DIAGNOSIS — J45909 Unspecified asthma, uncomplicated: Secondary | ICD-10-CM | POA: Insufficient documentation

## 2020-11-24 DIAGNOSIS — F17210 Nicotine dependence, cigarettes, uncomplicated: Secondary | ICD-10-CM | POA: Insufficient documentation

## 2020-11-24 MED ORDER — NIRMATRELVIR/RITONAVIR (PAXLOVID)TABLET
3.0000 | ORAL_TABLET | Freq: Two times a day (BID) | ORAL | 0 refills | Status: AC
  Filled 2020-11-24: qty 30, 5d supply, fill #0

## 2020-11-24 NOTE — Discharge Instructions (Addendum)
Take the medications as prescribed.  The medication often causes a bad taste in your mouth.  It will resolve after he finished the medication.  Return to the emergency room for high fevers or shortness of breath or other concerning symptoms

## 2020-11-24 NOTE — ED Provider Notes (Signed)
[facility] EMERGENCY DEPT Provider Note   CSN: [id number] Arrival date & time: 11/24/20  1237     History Chief Complaint  Patient presents with   Covid Positive    Allison James is a 40 y.o. female.  HPI  Patient presented to the ED for evaluation after testing positive for COVID.  Patient's has been vaccinated and boosted.  She states she started having symptoms of cough congestion, URI symptoms.  Patient did a home COVID test and it was positive today.  She is not having any shortness of breath.  No fevers.  Past Medical History:  Diagnosis Date   Asthma    Hypertension during pregnancy    Morbid obesity (HCC)    Seizures (HCC)    Sleep apnea     There are no problems to display for this patient.   Past Surgical History:  Procedure Laterality Date   CESAREAN SECTION  x 3   TUBAL LIGATION       OB History     Gravida      Para      Term      Preterm      AB      Living  3      SAB      IAB      Ectopic      Multiple      Live Births              Family History  Problem Relation Age of Onset   Hypertension Mother    Diabetes Mother    Pancreatitis Mother    Kidney failure Mother    Stroke Mother     Social History   Tobacco Use   Smoking status: Every Day    Packs/day: 1.00    Years: 24.00    Pack years: 24.00    Types: Cigarettes   Smokeless tobacco: Never  Vaping Use   Vaping Use: Never used  Substance Use Topics   Alcohol use: Yes    Comment: socially   Drug use: Yes    Types: Marijuana    Home Medications Prior to Admission medications   Medication Sig Start Date End Date Taking? Authorizing Provider  nirmatrelvir/ritonavir EUA (PAXLOVID) TABS Take 3 tablets by mouth 2 (two) times daily for 5 days. Patient GFR is >60. Take nirmatrelvir (150 mg) two tablets twice daily for 5 days and ritonavir (100 mg) one tablet twice daily for 5 days. 11/24/20 11/29/20 Yes Linwood Dibbles, MD  amoxicillin-clavulanate  (AUGMENTIN) 875-125 MG tablet Take 1 tablet by mouth every 12 (twelve) hours. 07/26/17   Ward, Chase Picket, PA-C  benzonatate (TESSALON) 100 MG capsule Take 1 capsule (100 mg total) by mouth every 8 (eight) hours. 10/12/18   Law, Waylan Boga, PA-C  ondansetron (ZOFRAN ODT) 4 MG disintegrating tablet Take 1 tablet (4 mg total) by mouth every 8 (eight) hours as needed for nausea or vomiting. 07/26/17   Ward, Chase Picket, PA-C  predniSONE (DELTASONE) 20 MG tablet Take 1 tablet (20 mg total) by mouth daily. 10/12/18   Emi Holes, PA-C    Allergies    Patient has no known allergies.  Review of Systems   Review of Systems  All other systems reviewed and are negative.  Physical Exam Updated Vital Signs BP (!) 160/108 (BP Location: Right Arm)   Pulse 78   Temp 98.5 F (36.9 C) (Oral)   Resp 16   LMP 04/07/2020 (Approximate)   SpO2  95%   Physical Exam Vitals and nursing note reviewed.  Constitutional:      General: She is not in acute distress.    Appearance: She is well-developed.     Comments: Increased BMI  HENT:     Head: Normocephalic and atraumatic.     Right Ear: External ear normal.     Left Ear: External ear normal.  Eyes:     General: No scleral icterus.       Right eye: No discharge.        Left eye: No discharge.     Conjunctiva/sclera: Conjunctivae normal.  Neck:     Trachea: No tracheal deviation.  Cardiovascular:     Rate and Rhythm: Normal rate and regular rhythm.  Pulmonary:     Effort: Pulmonary effort is normal. No respiratory distress.     Breath sounds: Normal breath sounds. No stridor. No wheezing or rales.  Abdominal:     General: Bowel sounds are normal. There is no distension.     Palpations: Abdomen is soft.     Tenderness: There is no abdominal tenderness. There is no guarding or rebound.  Musculoskeletal:        General: No tenderness or deformity.     Cervical back: Neck supple.  Skin:    General: Skin is warm and dry.     Findings: No  rash.  Neurological:     General: No focal deficit present.     Mental Status: She is alert.     Cranial Nerves: No cranial nerve deficit (no facial droop, extraocular movements intact, no slurred speech).     Sensory: No sensory deficit.     Motor: No abnormal muscle tone or seizure activity.     Coordination: Coordination normal.  Psychiatric:        Mood and Affect: Mood normal.    ED Results / Procedures / Treatments   Labs (all labs ordered are listed, but only abnormal results are displayed) Labs Reviewed - No data to display  EKG None  Radiology No results found.  Procedures Procedures   Medications Ordered in ED Medications - No data to display  ED Course  I have reviewed the triage vital signs and the nursing notes.  Pertinent labs & imaging results that were available during my care of the patient were reviewed by me and considered in my medical decision making (see chart for details).    MDM Rules/Calculators/A&P                           Patient has a positive home COVID test.  She does have risk factors of elevated BMI and hypertension.  Patient not having any difficulty with her breathing.  Her oxygenation is normal.  She is requesting paxlovid treatment I will give her prescription for that.  Warning signs precautions discussed Final Clinical Impression(s) / ED Diagnoses Final diagnoses:  COVID-19 virus infection    Rx / DC Orders ED Discharge Orders          Ordered    nirmatrelvir/ritonavir EUA (PAXLOVID) TABS  2 times daily        11/24/20 1419             Linwood Dibbles, MD 11/24/20 1421

## 2020-11-24 NOTE — ED Triage Notes (Signed)
Patient reports to the ER reporting she tested COVID + this morning. Patient reports she is vaccinated and has had both boosters

## 2021-04-26 DEATH — deceased

## 2022-02-27 IMAGING — CT CT HEAD W/O CM
4 series · 16 of 47 positions shown, 18 images · non-contrast
Comparison: None.

CLINICAL DATA: Seizures

EXAM:
CT HEAD WITHOUT CONTRAST
TECHNIQUE: Contiguous axial images were obtained from the base of the skull
through the vertex without intravenous contrast.

[Series 2: head bone · axial · 0.43mm/px · z∈[-113,-83]mm · 3 of 75 slices shown]
[im 8/75  bone]
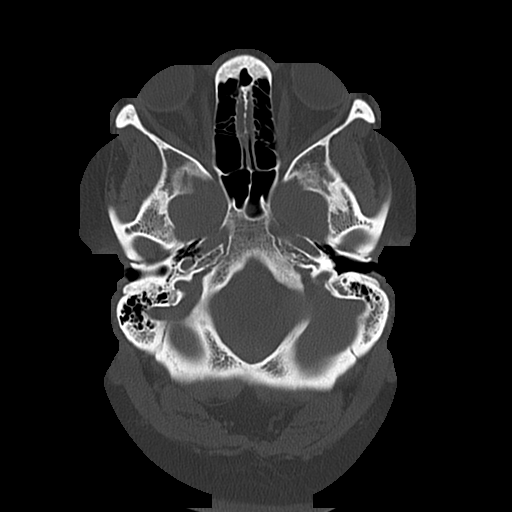
[im 15/75  bone]
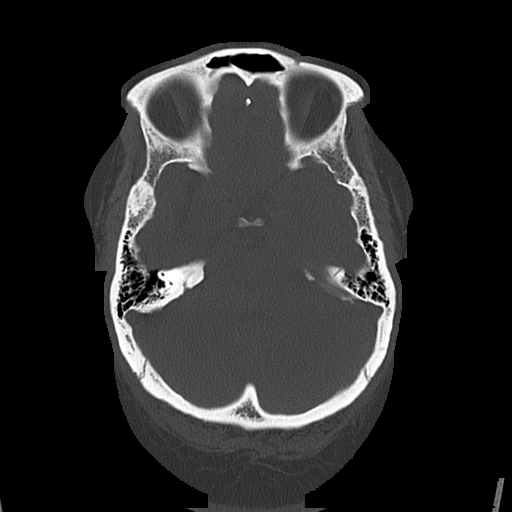
[im 23/75  bone]
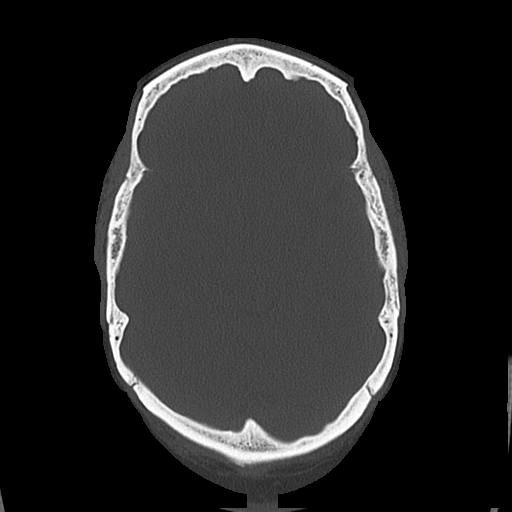

[Series 3: head wo · axial · 0.43mm/px · z∈[-112,-2]mm · 7 of 30 slices shown, 9 images]
[im 4/30  brain]
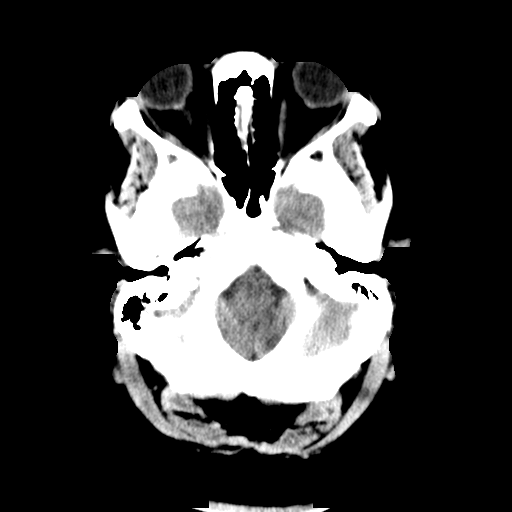
[im 4/30  bone]
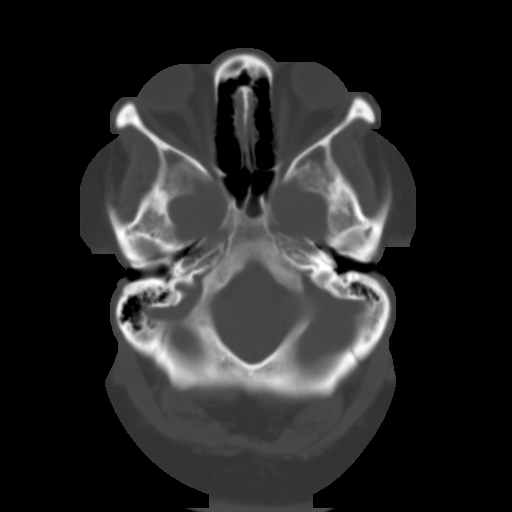
[im 8/30  brain]
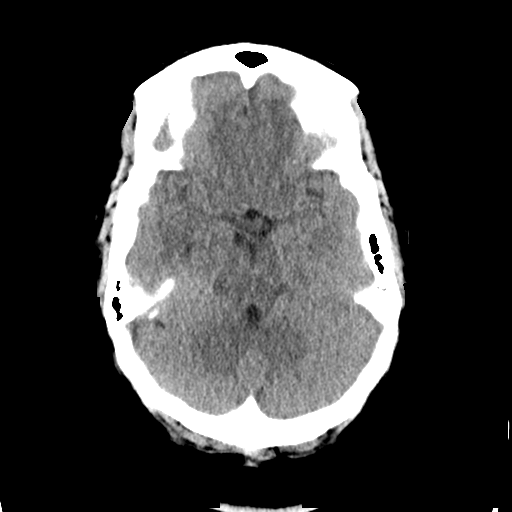
[im 11/30  brain]
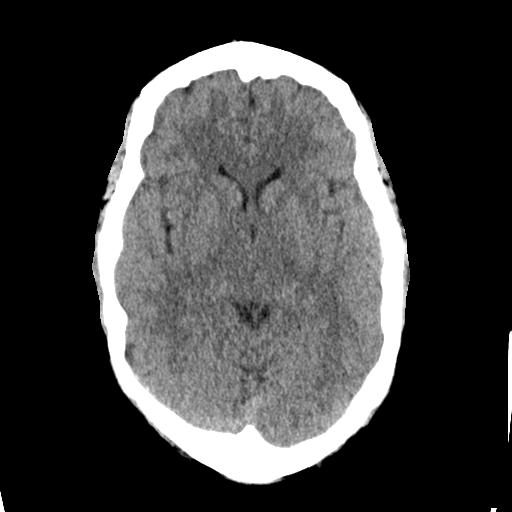
[im 15/30  brain]
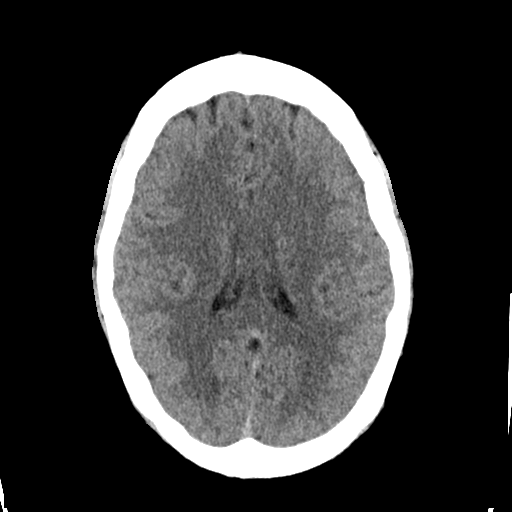
[im 19/30  brain]
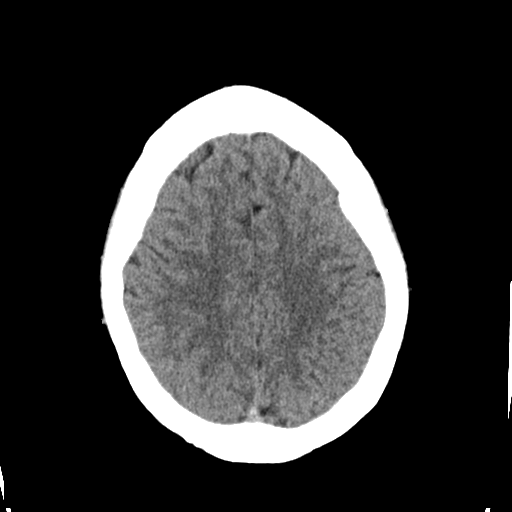
[im 19/30  bone]
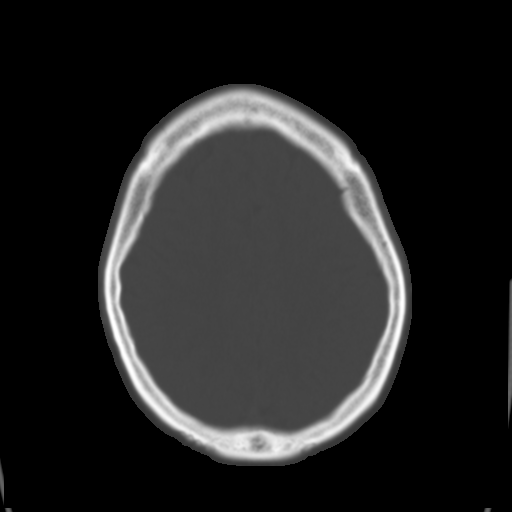
[im 22/30  brain]
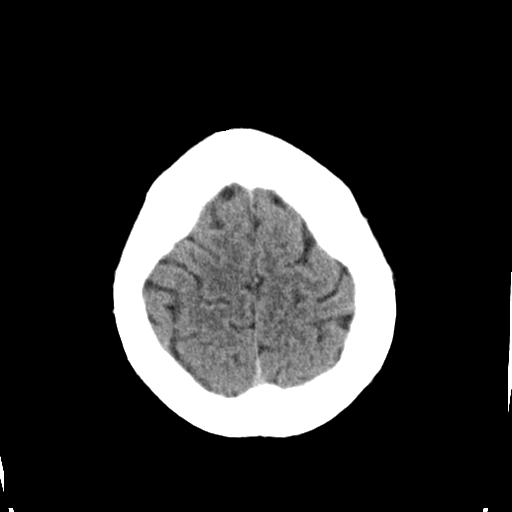
[im 26/30  brain]
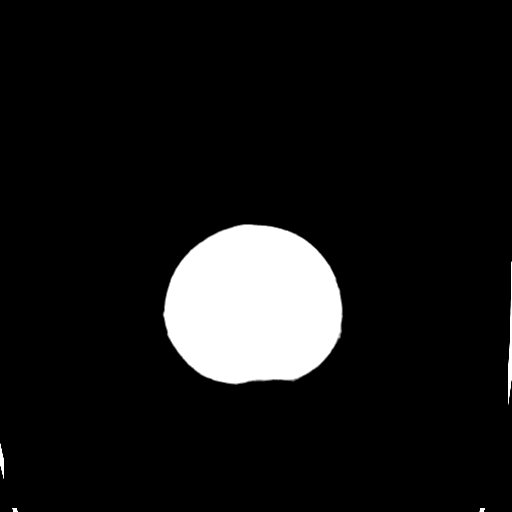

[Series 4: coronal soft · coronal · 0.28mm/px · 3 of 66 slices shown]
[im 22/66  brain]
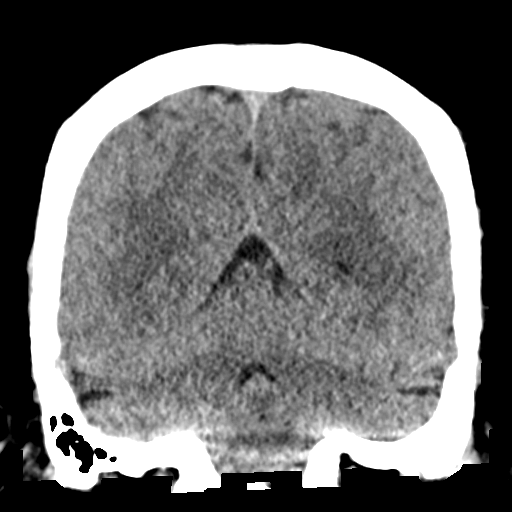
[im 29/66  brain]
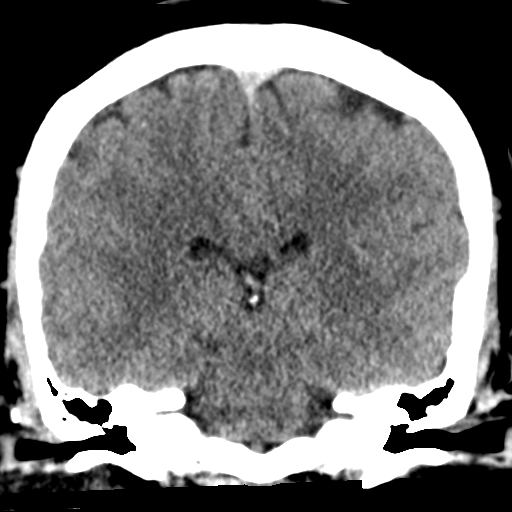
[im 37/66  brain]
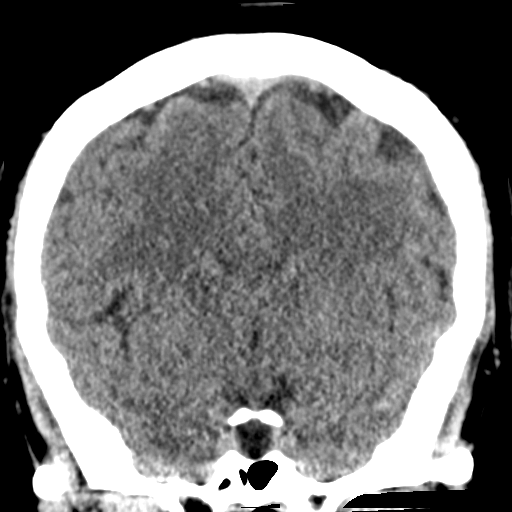

[Series 5: sagittal soft · sagittal · 0.28mm/px · 3 of 48 slices shown]
[im 16/48  brain]
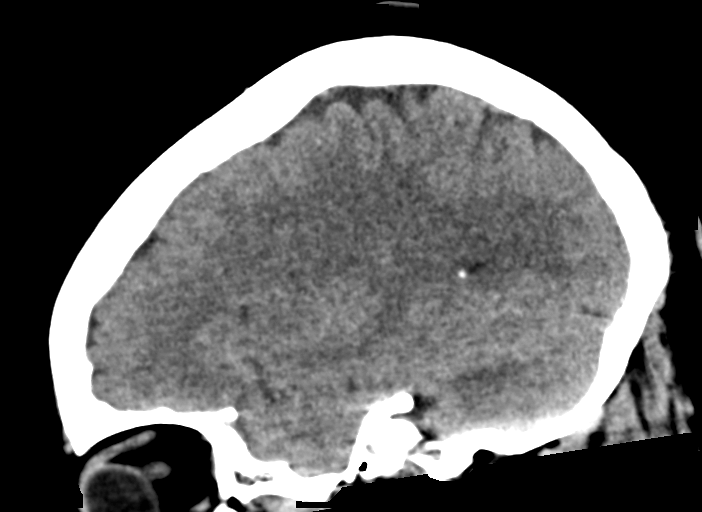
[im 24/48  brain]
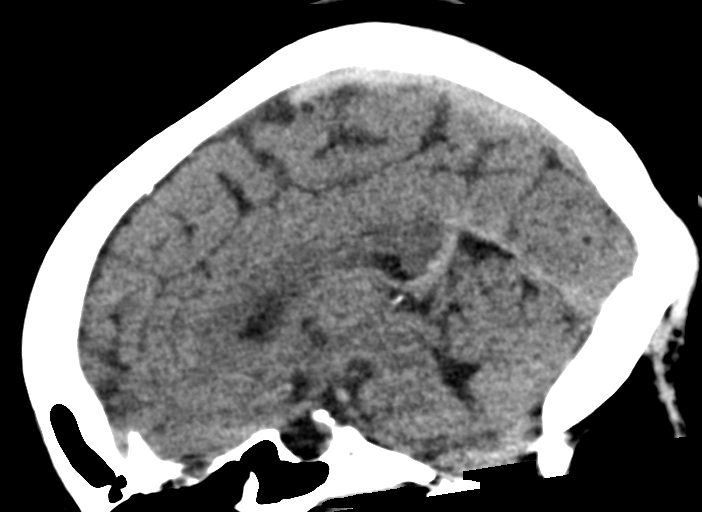
[im 32/48  brain]
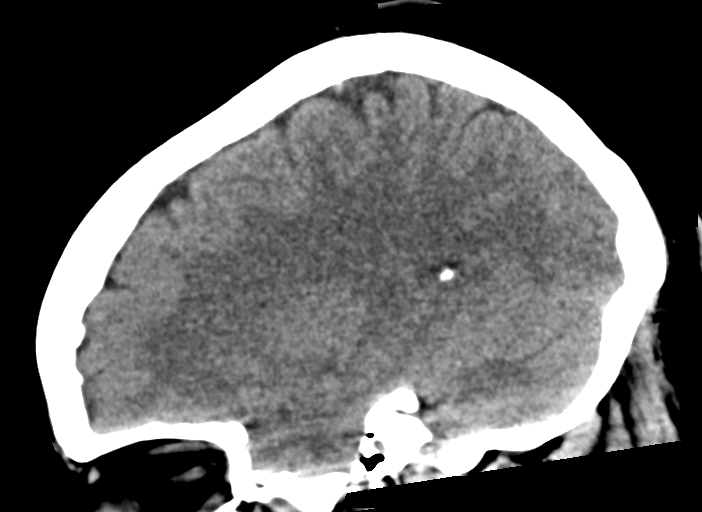

[16 of 47 positions shown; findings below may reference images not displayed]

FINDINGS: Brain: There is no mass, hemorrhage or extra-axial collection. The
size and configuration of the ventricles and extra-axial CSF spaces
are normal. The brain parenchyma is normal, without acute or chronic
infarction.

Vascular: No abnormal hyperdensity of the major intracranial
arteries or dural venous sinuses. No intracranial atherosclerosis.

Skull: The visualized skull base, calvarium and extracranial soft
tissues are normal.

Sinuses/Orbits: No fluid levels or advanced mucosal thickening of
the visualized paranasal sinuses. No mastoid or middle ear effusion.
The orbits are normal.
IMPRESSION: Normal head CT.
